# Patient Record
Sex: Female | Born: 1990 | Race: Asian | Hispanic: No | Marital: Single | Smoking: Never smoker
Health system: Southern US, Community
[De-identification: ages and names within clinical notes are randomized; demographics above are authoritative.]

## PROBLEM LIST (undated history)

## (undated) ENCOUNTER — Inpatient Hospital Stay (HOSPITAL_COMMUNITY): Payer: Self-pay

## (undated) DIAGNOSIS — K2970 Gastritis, unspecified, without bleeding: Secondary | ICD-10-CM

## (undated) DIAGNOSIS — O24419 Gestational diabetes mellitus in pregnancy, unspecified control: Secondary | ICD-10-CM

## (undated) DIAGNOSIS — Z789 Other specified health status: Secondary | ICD-10-CM

## (undated) HISTORY — PX: NO PAST SURGERIES: SHX2092

## (undated) HISTORY — DX: Other specified health status: Z78.9

---
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
## 2017-10-03 DIAGNOSIS — H5213 Myopia, bilateral: Secondary | ICD-10-CM | POA: Diagnosis not present

## 2018-01-28 NOTE — L&D Delivery Note (Signed)
Delivery Note Pt had PPROM at an unknown time followed by reg ctx; at 1325 she had her forebag broken for clear fluid and was 5cm. She progressed rapidly to complete and at 1:54 PM a viable female was delivered via Vaginal, Spontaneous (Presentation: LOA).  Tight nuchal x 1; delivered through using '[place].' APGAR: 9, 9; weight 5 lb 0.1 oz (2270 g). Cord clamped and cut by FOB after 1 min delay, then infant handed to Neo team at the warmer; hospital cord blood sample collected.  Placenta status: spont, intact.  Cord: 3 vessel  Anesthesia:  None Episiotomy: None Lacerations: None Est. Blood Loss (mL): 200  Mom to postpartum.  Baby to NICU.  Myrtis Ser CNM 09/18/2018, 2:33 PM  Please schedule this patient for Postpartum visit in: 6 weeks with the following provider: Any provider For C/S patients schedule nurse incision check in weeks 2 weeks: no High risk pregnancy complicated by: GDMA1, preterm delivery Delivery mode:  SVD Anticipated Birth Control:  other/unsure PP Procedures needed: 2 hour GTT  Schedule Integrated [place] visit: no

## 2018-02-25 DIAGNOSIS — Z3009 Encounter for other general counseling and advice on contraception: Secondary | ICD-10-CM | POA: Diagnosis not present

## 2018-02-25 DIAGNOSIS — Z3200 Encounter for pregnancy test, result unknown: Secondary | ICD-10-CM | POA: Diagnosis not present

## 2018-02-27 ENCOUNTER — Encounter (HOSPITAL_COMMUNITY): Payer: Self-pay

## 2018-02-27 ENCOUNTER — Other Ambulatory Visit: Payer: Self-pay

## 2018-02-27 ENCOUNTER — Inpatient Hospital Stay (HOSPITAL_COMMUNITY)
Admission: AD | Admit: 2018-02-27 | Discharge: 2018-02-27 | Disposition: A | Discharge status: Home / Self Care | Payer: BLUE CROSS/BLUE SHIELD | Attending: Obstetrics and Gynecology | Admitting: Obstetrics and Gynecology

## 2018-02-27 ENCOUNTER — Inpatient Hospital Stay (HOSPITAL_COMMUNITY): Payer: BLUE CROSS/BLUE SHIELD

## 2018-02-27 DIAGNOSIS — O368310 Maternal care for abnormalities of the fetal heart rate or rhythm, first trimester, not applicable or unspecified: Secondary | ICD-10-CM | POA: Diagnosis not present

## 2018-02-27 DIAGNOSIS — Z3A01 Less than 8 weeks gestation of pregnancy: Secondary | ICD-10-CM

## 2018-02-27 DIAGNOSIS — O26891 Other specified pregnancy related conditions, first trimester: Secondary | ICD-10-CM | POA: Insufficient documentation

## 2018-02-27 DIAGNOSIS — R109 Unspecified abdominal pain: Secondary | ICD-10-CM | POA: Insufficient documentation

## 2018-02-27 DIAGNOSIS — O208 Other hemorrhage in early pregnancy: Secondary | ICD-10-CM | POA: Diagnosis not present

## 2018-02-27 DIAGNOSIS — O209 Hemorrhage in early pregnancy, unspecified: Secondary | ICD-10-CM | POA: Diagnosis not present

## 2018-02-27 HISTORY — DX: Gastritis, unspecified, without bleeding: K29.70

## 2018-02-27 LAB — CBC
HCT: 40.8 % (ref 36.0–46.0)
HEMOGLOBIN: 14 g/dL (ref 12.0–15.0)
MCH: 29.7 pg (ref 26.0–34.0)
MCHC: 34.3 g/dL (ref 30.0–36.0)
MCV: 86.4 fL (ref 80.0–100.0)
Platelets: 222 10*3/uL (ref 150–400)
RBC: 4.72 MIL/uL (ref 3.87–5.11)
RDW: 12.8 % (ref 11.5–15.5)
WBC: 7.2 10*3/uL (ref 4.0–10.5)
nRBC: 0 % (ref 0.0–0.2)

## 2018-02-27 LAB — URINALYSIS, ROUTINE W REFLEX MICROSCOPIC
Bilirubin Urine: NEGATIVE
Glucose, UA: NEGATIVE mg/dL
Ketones, ur: 15 mg/dL — AB
Nitrite: NEGATIVE
Protein, ur: NEGATIVE mg/dL
Specific Gravity, Urine: 1.015 (ref 1.005–1.030)
pH: 7 (ref 5.0–8.0)

## 2018-02-27 LAB — URINALYSIS, MICROSCOPIC (REFLEX)

## 2018-02-27 LAB — TYPE AND SCREEN
ABO/RH(D): O POS
ANTIBODY SCREEN: NEGATIVE

## 2018-02-27 LAB — WET PREP, GENITAL
CLUE CELLS WET PREP: NONE SEEN
Sperm: NONE SEEN
Trich, Wet Prep: NONE SEEN
Yeast Wet Prep HPF POC: NONE SEEN

## 2018-02-27 LAB — ABO/RH: ABO/RH(D): O POS

## 2018-02-27 LAB — POCT PREGNANCY, URINE: Preg Test, Ur: POSITIVE — AB

## 2018-02-27 LAB — HCG, QUANTITATIVE, PREGNANCY: hCG, Beta Chain, Quant, S: 36487 m[IU]/mL — ABNORMAL HIGH (ref <5)

## 2018-02-27 NOTE — MAU Provider Note (Addendum)
History     CSN: [id number]  Arrival date and time: 02/27/18 62130804   None     Chief Complaint  Patient presents with  . Abdominal Pain   Elaine Lewis is a 28 y.o. G1P0 at 711w2d by Definite LMP who presents for Abdominal Pain.  She states the pain started about 0300 this morning and reports that it is sharp and "comes and goes."  Patient denies vaginal bleeding, but reports she had some bleeding last week immediately after intercourse.  She denies bleeding since and reports pelvic rest. She reports that she had a bowel movement this morning and denies issues.  She has not taken anything for the pain.  She expresses concern about her symptoms stating this is her first pregnancy.       OB History    Gravida  1   Para      Term      Preterm      AB      Living        SAB      TAB      Ectopic      Multiple      Live Births              Past Medical History:  Diagnosis Date  . Gastritis    As a child    Past Surgical History:  Procedure Laterality Date  . NO PAST SURGERIES      No family history on file.  Social History   Tobacco Use  . Smoking status: Never Smoker  . Smokeless tobacco: Never Used  Substance Use Topics  . Alcohol use: Not Currently    Frequency: Never  . Drug use: Never    Allergies: No Known Allergies  No medications prior to admission.    Review of Systems  Constitutional: Negative for chills and fever.  Gastrointestinal: Positive for abdominal pain and nausea. Negative for constipation, diarrhea and vomiting.  Genitourinary: Positive for vaginal discharge. Negative for dyspareunia, dysuria and vaginal bleeding.   Physical Exam   Blood pressure 136/72, pulse 67, temperature (!) 97.5 F (36.4 C), temperature source Oral, resp. rate 16, height 5\' 3"  (1.6 m), weight 75.6 kg, last menstrual period 01/07/2018, SpO2 100 %.  Physical Exam  Constitutional: She is oriented to person, place, and time. She appears  well-developed and well-nourished. No distress.  HENT:  Head: Normocephalic and atraumatic.  Eyes: Conjunctivae are normal.  Neck: Normal range of motion.  Cardiovascular: Normal rate, regular rhythm and normal heart sounds.  Respiratory: Effort normal and breath sounds normal.  GI: Soft. There is abdominal tenderness.  Musculoskeletal: Normal range of motion.        General: No edema.  Neurological: She is alert and oriented to person, place, and time.  Skin: Skin is warm and dry.  Psychiatric: She has a normal mood and affect. Her behavior is normal.    MAU Course  Procedures Results for orders placed or performed during the hospital encounter of 02/27/18 (from the past 24 hour(s))  Urinalysis, Routine w reflex microscopic     Status: Abnormal   Collection Time: 02/27/18  8:22 AM  Result Value Ref Range   Color, Urine YELLOW YELLOW   APPearance HAZY (A) CLEAR   Specific Gravity, Urine 1.015 1.005 - 1.030   pH 7.0 5.0 - 8.0   Glucose, UA NEGATIVE NEGATIVE mg/dL   Hgb urine dipstick TRACE (A) NEGATIVE   Bilirubin Urine NEGATIVE NEGATIVE  Ketones, ur 15 (A) NEGATIVE mg/dL   Protein, ur NEGATIVE NEGATIVE mg/dL   Nitrite NEGATIVE NEGATIVE   Leukocytes, UA MODERATE (A) NEGATIVE  Urinalysis, Microscopic (reflex)     Status: Abnormal   Collection Time: 02/27/18  8:22 AM  Result Value Ref Range   RBC / HPF 0-5 0 - 5 RBC/hpf   WBC, UA 6-10 0 - 5 WBC/hpf   Bacteria, UA FEW (A) NONE SEEN   Squamous Epithelial / LPF 6-10 0 - 5  Pregnancy, urine POC     Status: Abnormal   Collection Time: 02/27/18  8:32 AM  Result Value Ref Range   Preg Test, Ur POSITIVE (A) NEGATIVE  Wet prep, genital     Status: Abnormal   Collection Time: 02/27/18  8:57 AM  Result Value Ref Range   Yeast Wet Prep HPF POC NONE SEEN NONE SEEN   Trich, Wet Prep NONE SEEN NONE SEEN   Clue Cells Wet Prep HPF POC NONE SEEN NONE SEEN   WBC, Wet Prep HPF POC MANY (A) NONE SEEN   Sperm NONE SEEN   CBC     Status:  None   Collection Time: 02/27/18 10:56 AM  Result Value Ref Range   WBC 7.2 4.0 - 10.5 K/uL   RBC 4.72 3.87 - 5.11 MIL/uL   Hemoglobin 14.0 12.0 - 15.0 g/dL   HCT 16.140.8 09.636.0 - 04.546.0 %   MCV 86.4 80.0 - 100.0 fL   MCH 29.7 26.0 - 34.0 pg   MCHC 34.3 30.0 - 36.0 g/dL   RDW 40.912.8 81.111.5 - 91.415.5 %   Platelets 222 150 - 400 K/uL   nRBC 0.0 0.0 - 0.2 %  hCG, quantitative, pregnancy     Status: Abnormal   Collection Time: 02/27/18 10:56 AM  Result Value Ref Range   hCG, Beta Chain, Quant, S 36,487 (H) <5 mIU/mL  Type and screen     Status: None   Collection Time: 02/27/18 10:56 AM  Result Value Ref Range   ABO/RH(D) O POS    Antibody Screen NEG    Sample Expiration      03/02/2018 Performed at [facility], [address] [place], [place] [id number]    FINDINGS: Intrauterine gestational sac: Single  Yolk sac:  Visualized.  Embryo:  Visualized.  Cardiac Activity: Visualized.  Heart Rate: 65 bpm  CRL:   2.18 mm   5 w 5 d                  US EDC: 10/23/2018  Subchorionic hemorrhage:  Small subchorionic hemorrhage.  Maternal uterus/adnexae:  Right ovary: Normal  Left ovary: Normal  Other :None  Free fluid:  None  IMPRESSION: 1. There is a single living intrauterine gestation with an estimated gestational age of [redacted] weeks and 5 days. 2. Fetal bradycardia. Although findings may be seen with early gestational age, findings are suspicious and follow-up US in 10-14 days is advised to reassess fetal viability. 3. Small subchorionic hemorrhage.  MDM Pelvic Exam with cultures Labs: UA, Wet prep, and GC/CT Transvaginal US    Assessment and Plan  7 weeks by LMP Abdominal Cramping  -Exam findings discussed -Wet Prep/UA Pending -GC/CT collected and sent -Offered tylenol dosing; patient declines -Will await results   Follow Up (10:49 PM) Fetal Bradycardia SCH-Small  -US findings discussed with patient and need for follow up. -Dates changed to reflect US  EDD of 10/23/2018 -Informed of negative wet prep results. -[facility] including cause and  expectations. -Encouraged continued pelvic rest if bleeding continues. -No q/c -Encouraged to obtain a primary OB-information sheet provided -Outpatient [place] order placed for repeat US in ~ 2 weeks -Encouraged to call or return to MAU if symptoms worsen or with the onset of new symptoms. -Discharged to home in stable condition  Cherre Robins MSN, CNM 02/27/2018, 8:39 AM

## 2018-02-27 NOTE — MAU Note (Signed)
Pt was woken in the middle of the night with lower abdominal cramping, 5/10, now a 3/10. No bleeding, approx [redacted] weeks pregnant.

## 2018-02-27 NOTE — Discharge Instructions (Signed)
Vaginal Bleeding During Pregnancy, First Trimester  A small amount of bleeding from the vagina (spotting) is relatively common during early pregnancy. It usually stops on its own. Various things may cause bleeding or spotting during early pregnancy. Some bleeding may be related to the pregnancy, and some may not. In many cases, the bleeding is normal and is not a problem. However, bleeding can also be a sign of something serious. Be sure to tell your health care provider about any vaginal bleeding right away. Some possible causes of vaginal bleeding during the first trimester include:  Infection or inflammation of the cervix.  Growths (polyps) on the cervix.  Miscarriage or threatened miscarriage.  Pregnancy tissue developing outside of the uterus (ectopic pregnancy).  A mass of tissue developing in the uterus due to an egg being fertilized incorrectly (molar pregnancy). Follow these instructions at home: Activity  Follow instructions from your health care provider about limiting your activity. Ask what activities are safe for you.  If needed, make plans for someone to help with your regular activities.  Do not have sex or orgasms until your health care provider says that this is safe. General instructions  Take over-the-counter and prescription medicines only as told by your health care provider.  Pay attention to any changes in your symptoms.  Do not use tampons or douche.  Write down how many pads you use each day, how often you change pads, and how soaked (saturated) they are.  If you pass any tissue from your vagina, save the tissue so you can show it to your health care provider.  Keep all follow-up visits as told by your health care provider. This is important. Contact a health care provider if:  You have vaginal bleeding during any part of your pregnancy.  You have cramps or labor pains.  You have a fever. Get help right away if:  You have severe cramps in your  back or abdomen.  You pass large clots or a large amount of tissue from your vagina.  Your bleeding increases.  You feel light-headed or weak, or you faint.  You have chills.  You are leaking fluid or have a gush of fluid from your vagina. Summary  A small amount of bleeding (spotting) from the vagina is relatively common during early pregnancy.  Various things may cause bleeding or spotting in early pregnancy.  Be sure to tell your health care provider about any vaginal bleeding right away. This information is not intended to replace advice given to you by your health care provider. Make sure you discuss any questions you have with your health care provider. Document Released: 10/24/2004 Document Revised: 04/18/2016 Document Reviewed: 04/18/2016 Elsevier Interactive Patient Education  2019 Elsevier [employer].  [facility] Prenatal Care Providers   Center for [employer] [facility] at [facility]       Phone: [phone]  Center for [facility] at [place] Phone: [phone]  Center for [employer] at [facility]  Phone: [phone]  Center for [facility] at [facility]  Phone: [phone]  Center for [facility] at [place]  Phone: [phone]  [facility]       Phone: [phone]  [facility]    Phone: [phone]   [facility])    Phone: [phone]  [facility]    Phone: [phone]  [facility]    Phone: [phone]   [facility]  Phone: [phone]  [facility]  Phone: [phone]  [facility]   Phone: [phone]  [facility]    Phone: [phone]

## 2018-03-02 LAB — GC/CHLAMYDIA PROBE AMP (~~LOC~~) NOT AT ARMC
Chlamydia: NEGATIVE
Neisseria Gonorrhea: NEGATIVE

## 2018-03-30 ENCOUNTER — Ambulatory Visit (HOSPITAL_COMMUNITY)
Admission: RE | Admit: 2018-03-30 | Discharge: 2018-03-30 | Disposition: A | Discharge status: Home / Self Care | Payer: BLUE CROSS/BLUE SHIELD | Source: Ambulatory Visit

## 2018-03-30 ENCOUNTER — Other Ambulatory Visit (HOSPITAL_COMMUNITY): Payer: Self-pay

## 2018-03-30 ENCOUNTER — Ambulatory Visit (INDEPENDENT_AMBULATORY_CARE_PROVIDER_SITE_OTHER): Payer: BLUE CROSS/BLUE SHIELD | Admitting: *Deleted

## 2018-03-30 ENCOUNTER — Other Ambulatory Visit: Payer: Self-pay

## 2018-03-30 DIAGNOSIS — O209 Hemorrhage in early pregnancy, unspecified: Secondary | ICD-10-CM | POA: Insufficient documentation

## 2018-03-30 DIAGNOSIS — Z3A10 10 weeks gestation of pregnancy: Secondary | ICD-10-CM | POA: Diagnosis not present

## 2018-03-30 DIAGNOSIS — O208 Other hemorrhage in early pregnancy: Secondary | ICD-10-CM | POA: Diagnosis not present

## 2018-03-30 NOTE — Progress Notes (Signed)
Here for [place] results. Reviewed with Hogan,CNm and informed [place] US shows live pregancy at [redacted]w[redacted]d with EDD 10/25/18.Reccommend she start prenatal care and prenatal vitamins. She states already has scheduled with [place] and is taking prenatal vitamins.  Aima Mcwhirt,RN

## 2018-03-30 NOTE — Progress Notes (Signed)
I have reviewed the chart and agree with nursing staff's documentation of this patient's encounter.  Thressa Sheller DNP, CNM  03/30/18  4:42 PM

## 2018-04-17 ENCOUNTER — Ambulatory Visit (INDEPENDENT_AMBULATORY_CARE_PROVIDER_SITE_OTHER): Payer: BLUE CROSS/BLUE SHIELD | Admitting: *Deleted

## 2018-04-17 ENCOUNTER — Other Ambulatory Visit: Payer: Self-pay

## 2018-04-17 ENCOUNTER — Encounter: Payer: Self-pay | Admitting: *Deleted

## 2018-04-17 ENCOUNTER — Other Ambulatory Visit (HOSPITAL_COMMUNITY)
Admission: RE | Admit: 2018-04-17 | Discharge: 2018-04-17 | Disposition: A | Discharge status: Home / Self Care | Payer: BLUE CROSS/BLUE SHIELD | Source: Ambulatory Visit | Attending: Obstetrics & Gynecology | Admitting: Obstetrics & Gynecology

## 2018-04-17 VITALS — BP 105/67 | HR 64 | Wt 156.5 lb

## 2018-04-17 DIAGNOSIS — Z3400 Encounter for supervision of normal first pregnancy, unspecified trimester: Secondary | ICD-10-CM | POA: Diagnosis not present

## 2018-04-17 DIAGNOSIS — Z23 Encounter for immunization: Secondary | ICD-10-CM

## 2018-04-17 DIAGNOSIS — O0990 Supervision of high risk pregnancy, unspecified, unspecified trimester: Secondary | ICD-10-CM | POA: Insufficient documentation

## 2018-04-17 NOTE — Progress Notes (Signed)
New Ob intake completed and pregnancy information packet given. Medicaid Home form completed. Pt reports occasional spotting (pink/brown) and some abdominal cramping. She denies passing clots. Last Pap greater than 2 years ago - normal. Initial prenatal visit scheduled on 4/7.

## 2018-04-17 NOTE — Progress Notes (Signed)
I have reviewed the chart and agree with nursing staff's documentation of this patient's encounter. Patient has SIUP confirmed in MAU with [facility].   Vonzella Nipple, PA-C 04/17/2018 12:47 PM

## 2018-04-18 LAB — OBSTETRIC PANEL, INCLUDING HIV
ANTIBODY SCREEN: NEGATIVE
Basophils Absolute: 0 10*3/uL (ref 0.0–0.2)
Basos: 1 %
EOS (ABSOLUTE): 0.1 10*3/uL (ref 0.0–0.4)
Eos: 2 %
HIV Screen 4th Generation wRfx: NONREACTIVE
Hematocrit: 39.3 % (ref 34.0–46.6)
Hemoglobin: 13.3 g/dL (ref 11.1–15.9)
Hepatitis B Surface Ag: NEGATIVE
Immature Grans (Abs): 0 10*3/uL (ref 0.0–0.1)
Immature Granulocytes: 0 %
Lymphocytes Absolute: 0.9 10*3/uL (ref 0.7–3.1)
Lymphs: 12 %
MCH: 29 pg (ref 26.6–33.0)
MCHC: 33.8 g/dL (ref 31.5–35.7)
MCV: 86 fL (ref 79–97)
Monocytes Absolute: 0.5 10*3/uL (ref 0.1–0.9)
Monocytes: 6 %
Neutrophils Absolute: 6 10*3/uL (ref 1.4–7.0)
Neutrophils: 79 %
Platelets: 254 10*3/uL (ref 150–450)
RBC: 4.58 x10E6/uL (ref 3.77–5.28)
RDW: 12.9 % (ref 11.7–15.4)
RPR Ser Ql: NONREACTIVE
Rh Factor: POSITIVE
Rubella Antibodies, IGG: <0.9 index — ABNORMAL LOW (ref >0.99)
WBC: 7.5 10*3/uL (ref 3.4–10.8)

## 2018-04-19 LAB — CULTURE, OB URINE

## 2018-04-19 LAB — URINE CULTURE, OB REFLEX

## 2018-04-20 ENCOUNTER — Encounter: Payer: Self-pay | Admitting: *Deleted

## 2018-04-20 LAB — GC/CHLAMYDIA PROBE AMP (~~LOC~~) NOT AT ARMC
Chlamydia: NEGATIVE
NEISSERIA GONORRHEA: NEGATIVE

## 2018-04-28 ENCOUNTER — Telehealth: Payer: Self-pay | Admitting: Family Medicine

## 2018-04-28 NOTE — Telephone Encounter (Signed)
Left a detailed message about appt change  °

## 2018-05-04 ENCOUNTER — Other Ambulatory Visit (HOSPITAL_COMMUNITY)
Admission: RE | Admit: 2018-05-04 | Discharge: 2018-05-04 | Disposition: A | Discharge status: Home / Self Care | Payer: BLUE CROSS/BLUE SHIELD | Source: Ambulatory Visit | Attending: Medical | Admitting: Medical

## 2018-05-04 ENCOUNTER — Ambulatory Visit (INDEPENDENT_AMBULATORY_CARE_PROVIDER_SITE_OTHER): Payer: BLUE CROSS/BLUE SHIELD | Admitting: Advanced Practice Midwife

## 2018-05-04 ENCOUNTER — Encounter: Payer: Self-pay | Admitting: Advanced Practice Midwife

## 2018-05-04 ENCOUNTER — Other Ambulatory Visit: Payer: Self-pay

## 2018-05-04 VITALS — BP 120/81 | HR 89 | Temp 98.2°F | Wt 157.3 lb

## 2018-05-04 DIAGNOSIS — Z3400 Encounter for supervision of normal first pregnancy, unspecified trimester: Secondary | ICD-10-CM | POA: Diagnosis not present

## 2018-05-04 DIAGNOSIS — Z3482 Encounter for supervision of other normal pregnancy, second trimester: Secondary | ICD-10-CM | POA: Diagnosis not present

## 2018-05-04 DIAGNOSIS — Z3401 Encounter for supervision of normal first pregnancy, first trimester: Secondary | ICD-10-CM

## 2018-05-04 DIAGNOSIS — Z31430 Encounter of female for testing for genetic disease carrier status for procreative management: Secondary | ICD-10-CM | POA: Diagnosis not present

## 2018-05-04 DIAGNOSIS — Z3A15 15 weeks gestation of pregnancy: Secondary | ICD-10-CM

## 2018-05-04 NOTE — Progress Notes (Signed)
Subjective:   Elaine Lewis is a 28 y.o. G1P0 at 8076w1d by early ultrasound being seen today for her first obstetrical visit.  Her obstetrical history is significant for none . Patient does intend to breast feed. Pregnancy history fully reviewed.    Patient reports no complaints. [facility]. She has had some spotting off an on.   HISTORY: OB History  Gravida Para Term Preterm AB Living  1 0 0 0 0 0  SAB TAB Ectopic Multiple Live Births  0 0 0 0 0    # Outcome Date GA Lbr Len/2nd Weight Sex Delivery Anes PTL Lv  1 Current            p Last pap smear was done > 2 years ago and was unsure patient reports that she never heard back after the pap to know results, but assumed it was normal. She denies any hx of abnormal pap smears.   Past Medical History:  Diagnosis Date  . Gastritis    As a child  . Gastritis    age 28  . Medical history non-contributory    Past Surgical History:  Procedure Laterality Date  . NO PAST SURGERIES     Family History  Problem Relation Age of Onset  . Hyperlipidemia Mother   . Thyroid disease Mother   . Gout Father    Social History   Tobacco Use  . Smoking status: Never Smoker  . Smokeless tobacco: Never Used  Substance Use Topics  . Alcohol use: Not Currently    Frequency: Never  . Drug use: Never   No Known Allergies Current Outpatient Medications on File Prior to Visit  Medication Sig Dispense Refill  . Prenatal Vit-Fe Fumarate-FA (MULTIVITAMIN-PRENATAL) 27-0.8 MG TABS tablet Take 1 tablet by mouth daily at 12 noon.     No current facility-administered medications on file prior to visit.     Review of Systems Pertinent items noted in HPI and remainder of comprehensive ROS otherwise negative.  Exam   Vitals:   05/04/18 1029  BP: 120/81  Pulse: 89  Temp: 98.2 F (36.8 C)  Weight: 157 lb 4.8 oz (71.4 kg)   Fetal Heart Rate (bpm): 154  Physical Exam  Constitutional: She is oriented to person, place, and time and  well-developed, well-nourished, and in no distress. No distress.  HENT:  Head: Normocephalic.  Cardiovascular: Normal rate.  Pulmonary/Chest: Effort normal.  Abdominal: Soft. There is no abdominal tenderness. There is no rebound.  Genitourinary:    Genitourinary Comments:  External: no lesion Vagina: small amount of brown blood noted  Cervix: pink, smooth, no CMT, pap collected  Uterus: AGA    Neurological: She is alert and oriented to person, place, and time.  Skin: Skin is warm and dry.  Psychiatric: Affect normal.  Nursing note and vitals reviewed.  Assessment:   Pregnancy: G1P0 Patient Active Problem List   Diagnosis Date Noted  . Supervision of low-risk first pregnancy 04/17/2018  . Vaginal bleeding before [redacted] weeks gestation 02/27/2018     Plan:  1. Encounter for supervision of low-risk first pregnancy, antepartum - Baby scripts optimization  - US MFM OB COMP + 14 WK; Future - Panorama today/ SMA/CF - Pap smear today    Initial labs done at intake, reviewed today Continue prenatal vitamins. Genetic Screening discussed, NIPS: requested. Ultrasound discussed; fetal anatomic survey: ordered. Problem list reviewed and updated. The nature of [place] [facility] with multiple MDs and  other Advanced Practice Providers was explained to patient; also emphasized that residents, students are part of our team. Routine obstetric precautions reviewed. 50% of 45 min visit spent in counseling and coordination of care. Return in about 5 weeks (around 06/08/2018) for tele-visit .  Thressa Sheller DNP, CNM  05/04/18  10:44 AM

## 2018-05-04 NOTE — Patient Instructions (Addendum)
Social Distancing FAQ: How It Helps Prevent COVID-19 (Coronavirus) and Steps We Can Take to Protect Ourselves There are many things we can do to prevent the spread of COVID-19 (coronavirus): washing our hands, coughing into our elbows, avoiding touching our faces, staying home if we're feeling sick and social distancing. But what is social distancing? These frequently asked questions explain how to practice social distancing in order to protect yourself, your loved ones and your community. General Information About Social Distancing Q: What is social distancing? A: Social distancing is the practice of purposefully reducing close contact between people. According to the CDC, social distancing means: . Remaining out of "congregate settings" as much as possible. . Avoiding mass gatherings. . Maintaining distance of about 6 feet from others when possible. Q: Why is social distancing important? A: Social distancing is crucial for preventing the spread of contagious illnesses such as COVID-19 (coronavirus). COVID-19 can spread through coughing, sneezing and close contact. By minimizing the amount of close contact we have with others, we reduce our chances of catching the virus and spreading it to our loved ones and within our community. Q: Who is social distancing important for? A: Social distancing is important for all of [place], but those of [place] who are at higher risk of serious complications caused by UXNAT-55 should be especially cautious about social distancing. People who are at high risk of complications include: . Older adults. Marland Kitchen People who have serious chronic medical conditions like heart disease, diabetes and lung disease. Q: What is "flattening the curve"? What does it have to do with social distancing? A: "Flattening the curve" refers to reducing the number of people who are sick at one time. If there are high surges in the number of COVID-19 cases all at once, health care systems and resources  could potentially become overwhelmed. Efforts that help stop COVID-19 from spreading rapidly - like social distancing - help keep the number of people who are sick at one time as low as possible. Q: When should I start practicing social distancing? A: The best time to begin social distancing is before an illness like COVID-19 becomes widespread throughout your community. Each community's situation is unique, so it is important to follow the guidance of local government, health departments and health care providers. Currently in [place], gatherings of more than 10 people are banned. For the latest information on DDUKG-25 policies in [place], visit the [place] website and view news releases and executive orders. The advice below is applicable if you are symptom-free and have reasonable confidence that you have not had exposure to COVID-19. Always follow the guidance of local government, health departments and your health care providers. Visiting Public Spaces Q: How can I practice social distancing in the workplace? A: When possible, keeping about 6 feet of distance between yourself and others is key. It's also important to practice other preventative measures such as washing hands, avoiding touching your face, coughing into your elbow and staying home if you feel sick. Depending on your job and your community's situation, working from home may be an option. Always follow local guidance. Q: How can my child practice social distancing at school or at college?  A: Many schools and universities in the [place]. have postponed in-person classes; currently in [place], [employer] schools have been closed until May 15 (this is subject to change). It's important to follow local guidance, as each community's situation is unique. It's important for people  of all ages to follow preventative measures, including staying about 6 feet away from others, avoiding  touching your face, washing your hands and coughing into your elbow. Q: Should I be concerned about going to the grocery store? A: In any place where large numbers of people gather, there is potential risk for disease transmission. When you visit the grocery store, keep about 6 feet between yourself and others and use prevention techniques like avoiding touching your face and washing your hands. If possible, visit the store at times when there are likely to be fewer people shopping. Q: Should I take public transportation? A: If you have the option, driving yourself, walking to work or working from home can help reduce the number of people who are using public transportation, which benefits you and your community. In any of these situations, it's very important to keep distance between yourself and others in addition to practicing other preventative measures. Q: Should I stop visiting restaurants and bars? A: Currently in [place], restaurants and bars have been closed until further notice. Always follow local guidance. Avoiding public places as much as possible helps prevent diseases from spreading. If dining out is a non-essential activity, then it is generally in the best interest of you and your loved ones to avoid it. Q: Can I still go to the gym? A: Currently in [place], gyms have been closed until further notice. Always follow local guidance. Alternatives could include exercising in your home or yard or walking through your neighborhood. If you do go to the gym, wipe down and sanitize your equipment, keep distance between yourself and others, avoid touching other people and practice other preventative techniques.  Q: What about events and places where many people gather, such as concerts, festivals, sporting events and churches? A: Risk of disease transmission is much higher in large groups of people. Effective March 25 at 5 PM in [place], meetings of over 10 people are prohibited  in order to prevent COVID-19 from spreading rapidly. Avoiding these gatherings are generally in the best interest of protecting yourself, your loved ones and your community. Always follow local guidance. Meeting with Others Q: Should I stop visiting my elderly relatives and friends?  A: Older adults are at high risk of serious complications from LPFXT-02. Limiting their exposure as much as possible to those who may be sick or who may be carrying the disease is crucial. This is a great opportunity to try other methods of connecting, such as over the phone or through a video chat. Q: What about social distancing with other people in my household? A: Avoiding close contact within a household is almost impossible, and social distancing is mainly focused on large groups. However, if someone in your household is sick, it's important to minimize close contact with them as much as is reasonable. Q: Should I stop meeting up with 1 or 2 friends? Should I stop dating? A: While meeting up with another person who also is symptom-free may be alright in some situations, keep in mind that risk of disease transmission is higher in public places. Now may be a good time to consider other methods of connecting with others, such as over the phone or through a video chat. Q: Can I have a small group of my extended family and/or friends over to my house? A: If possible, it's best to postpone these kinds of gatherings and look for alternative ways to connect. Avoiding non-essential gatherings is important for preventing the spread  of disease. Q: Should my family and friends cancel big gathering events like weddings and birthday parties? A: Effective March 25 at 5 PM in [place], meetings of over 10 people are prohibited in order to prevent COVID-19 from spreading rapidly. Always follow local guidance. While it's difficult to postpone important events like these, it's also very important to protect our loved ones -  especially our loved ones who are most vulnerable. It may be best to postpone or alter your plans.  Q: If I'm avoiding in-person gatherings, how can I stay connected to others? A: There are many ways you can connect with friends: phone calls, text messages, emails and video chats are all great virtual options. While physical social distancing is important for our health, so is social interaction - trying alternative ways to stay connected is a good way to take care of your emotional health. If You Are Experiencing Symptoms Q: How should I approach social distancing if I start to feel sick? A: If you begin to experience symptoms, it's important to stay home and distance yourself from others. Always follow the guidance of your health care providers and local government. Q: Can I have visitors while I am in quarantine? A: Having visitors should be avoided as much as possible. Quarantining is an important way to protect your loved ones and community from picking up the disease; visitors are at high risk of catching the illness from you. Q: Can I go outside in my yard if I am in quarantine? A: Depending on where you live and your community's situation, going outside and getting some fresh air in your yard may be safe. Always follow the guidance of your health care providers and local government. AREA PEDIATRIC/FAMILY PRACTICE PHYSICIANS  Central[place]outheast [place] [phone]) . [facility] Davy Pique, MD; Gwendlyn Deutscher, MD; Walker Kehr, MD; Andria Frames, MD; McDiarmid, MD; Dutch Quint, MD; Nori Riis, MD; Mingo Amber, [address]., [place], [place] [address] o [phone] o Mon-Fri 8:30-12:30, 1:30-5:00 o Providers come to see babies at [facility] o Accepting Medicaid . [place] at [place] providers who accept newborns: Dorthy Cooler, MD; Orland Mustard, MD; Stephanie Acre, MD o [place], [place], [place] [address] o [phone] o Mon-Fri 8:00-5:30 o Babies seen by  providers at [facility] o Does NOT accept Medicaid o Please call early in hospitalization for appointment (limited availability)  . Mustard [place]ola, MD o [address] [place], [place] [address] o [phone] o Mon, Tue, Thur, Fri 8:30-5:00, Wed 10:00-7:00 (closed 1-2pm) o Babies seen by [facility] providers o Accepting Medicaid . [place], MD o [place], [place], [place] [address] o [phone] o Mon-Fri 8:30-5:00, Sat 8:30-12:00 o Provider comes to see babies at [place] Medicaid o Must have been referred from current patients or contacted office prior to delivery . [facility] ([place] for [place]) Franne Forts, MD; Tamera Punt, MD; Doneen Poisson, MD; Fatima Sanger, MD; Wynetta Emery, MD; Jess Barters, MD; Tami Ribas, MD; Herbert Moors, MD; Derrell Lolling, MD; Dorothyann Peng, MD; Lucious Groves, NP; Baldo Ash, NP o [place] [address], [place], [place] [address] o [phone] o Mon, Tue, Thur, Fri 8:30-5:30, Wed 9:30-5:30, Sat 8:30-12:30 o Babies seen by [facility] providers o Accepting Medicaid o Only accepting infants of first-time parents or siblings of current patients [facility] discharge coordinator will make follow-up appointment . Baltazar Najjar o [place] [address] [place], [place]  [address] o [phone]   Fax - [phone] . [facility] o [address], [place], [place]  [address] o Phone - [phone]   Fax [phone] . [place], [place], [place], [place]  [address] o [phone]  [place]ortheast [place] [phone]) . [facility] Reginal Lutes, MD; Jacklynn Ganong, MD; Torrie Mayers, MD; MD; Rosana Hoes, MD; Servando Salina, MD; Rose Fillers, MD; Rex Kras, MD; Corinna Capra, MD; Volney American, MD; Trilby Drummer, MD; Janann Colonel, MD; Jimmye Norman, [place], [place], [place] [address] o [phone] o Mon-Fri 8:30-5:00 (extended evenings Mon-Thur as needed), Sat-Sun  10:00-1:00 o Providers come to see babies at [place] Medicaid for families of first-time babies and families with all children in the household age 46 and under. Must register with office prior to making appointment (M-F only). . [place], NP; Tomi Bamberger, MD; Redmond School, MD; [place], [place] [address]., [place], [place] [address] o [phone] o Mon-Fri 8:00-5:00 o Babies seen by providers at [facility] o Does NOT accept Medicaid/Commercial Insurance Only . [facility] [place] ([facility])  Marnee Guarneri, MD; Drema [place], MD; Montine Circle, MD; Vilma Prader, MD; Vanita Panda, MD; Alfonso Ramus, MD; Ruthann Cancer, MD; Roxanne Mins, MD; Rosalva Ferron, MD; Polly Cobia, MD o [place]., [place], [place] [address] o [phone] o Mon-Fri 8:30-5:30, Sat (Oct.-Mar.) 9:00-1:00 o Babies seen by providers at [place] [phone]) . [facility] Peers, MD; Suzan Slick, MD o [address], [place], [place] [address] o [phone] o Mon-Fri 8:30-5:00, Sat 8:30-12:00 o Providers come to see babies at [facility] o Does NOT accept Medicaid . [place] at [place], [place]; [place], MD; [place], [place]; Nancy Fetter, MD; Moreen Fowler, [place], [place], [place] [address] o [phone] o Mon-Fri 8:00-5:00 o Babies seen by providers at [facility] o Does NOT accept Medicaid o Only accepting babies of parents who are patients o Please call early in hospitalization for appointment (limited availability) . [facility] Blanca Friend, MD; Sharlene Motts, MD; Rod Can, MD; Warner Mccreedy, NP; Sabra Heck, MD; Ermalinda Memos, MD; Sharlett Iles, NP; Aurther Loft, MD; Jerrye Beavers, MD; Marcello Moores, MD; Berline Lopes, MD; Charolette Forward, MD o [place]. [place], [place], [place] [address] o [phone] o Mon-Fri 8:00-5:00, Sat 9:00-12:00 o Providers come to see babies at [facility] o Does NOT accept  [facility] [phone]) . [place] at [place] providers accepting new patients: Dayna Ramus, NP; [place], [place], [place], [place] [address] o [phone] o Mon-Fri 8:00-5:00 o Babies seen by providers at [facility] o Does NOT accept Medicaid o Only accepting babies of parents who are patients o Please call early in hospitalization for appointment (limited availability) . [facility] Oswaldo [place], MD; Sheran Lawless, MD o [place]., [place], [place] [address] o [phone] (press 1 to schedule appointment) o Mon-Fri 8:00-5:00 o Providers come to see babies at [facility] o Does NOT accept Medicaid . [facility] Jodi Mourning, MD o [address] [place], [place] [address] o [phone] o Mon-Fri 8:30-5:00 (lunch 12:30-1:00), extended hours by appointment only Wed 5:00-6:30 o Babies seen by [facility] providers o Accepting Medicaid . [place] at Evalyn Casco, MD; [place], MD; Ethlyn Gallery, MD o [place], [place],  [address] o [phone] o Mon-Fri 8:00-5:00 o Babies seen by [facility] providers o Does NOT accept Medicaid . Therapist, music at [employer] o  Jerline Pain, MD; Yong Channel, MD; [place], [place] [address] [place], [place] [address] o [phone] o Mon-Fri 8:00-5:00 o Babies seen by [facility] providers o Does NOT accept Medicaid . [place], [place]; [place], [place]; [place], NP; Albertina Parr, MD; Frederic Jericho, MD; Ronney Lion, MD; Carlos Levering, NP; Jerelene Redden, NP; Tomasita Crumble, NP; Ronelle Nigh, NP; Corinna Lines, MD; Lead, MD o [place]., [place], [place] [address] o [phone] o Mon-Fri 8:30-5:00, Sat 10:00-1:00 o Providers come to see babies at [facility] o Does NOT accept Medicaid o Free prenatal information session Tuesdays at 4:45pm . [facility] Porfirio Oar, MD; [place], [place]; [place], [place]; [place], [place]., [place] [address] o [phone] o Mon-Fri 7:30-5:30 o Babies seen by [facility] providers . [facility] o [address], [place], [place], [place]  [address] o [phone]   Fax - [phone]  [place] [phone] & [phone]) . [place], [place] o [address] [place], [place] [address] o [phone] o Mon-Thur 8:00-6:00 o Providers come to see babies at [place] Medicaid . [place], NP; Melford Aase, MD; [place], [place]; [place], [place]., [place], [place] [address] o [phone] o Mon-Thur 7:30-7:30, Fri 7:30-4:30 o Babies seen by [facility] providers o Accepting Medicaid . [facility] Nyra Jabs, MD; Cristino Martes, NP; Gertie Baron, MD o [place] [address], [place], [place] [address] o [phone] o Mon-Fri 8:30-5:00, Sat 8:30-12:00 o Providers come to see babies at [place] Medicaid o Must have "Meet & Greet" appointment at office prior to delivery . [place] ([place]) Jodene Nam, MD; Juleen China, MD; Clydene Laming, [place] [address], [place], [place] [address] o [phone] o Mon-Wed 8:00-6:00, Thur-Fri 8:00-5:00, Sat 9:00-12:00 o Providers come to see babies at [facility] o Does NOT accept Medicaid o Only accepting siblings of current patients . [facility] [place], [place], [place], [place]  [address] o [phone]   Fax [phone] . [place] at [place] [address] [place], [place]  [address] o [phone]   Fax - [place] [phone] & [phone]) . Therapist, music at [place], DO; [place], [place]., [place], [place] [address] o [phone] o Mon-Fri 7:00-5:00 o Babies seen by [facility] providers o Does NOT  accept Medicaid . [place], MD; [place], [place]; [place], [place], [place], [place] [address] o [phone] o Mon-Fri 8:00-5:00 o Babies seen by [facility] providers o Accepting Medicaid . [place], MD; [place], [place]; [place], NP; [place], [place], [place],  [address] o [phone] o Mon-Fri 8:00-5:00 o Babies seen by providers at [place] [phone]) . [facility] [place], [place] o [place]., [place],  [address] o [phone] o Mon-Fri 8:00-5:00 o Babies seen by [facility] providers o Does NOT accept Medicaid o Limited availability, please call early in hospitalization to schedule follow-up . [facility] Leilani Merl, PA; Maisie Fus, MD; [place], MD; [place], [place]; Captain Cook, MD; [place], [facility] [address], [place],  [address] o [phone] o Mon-Fri 8:30-5:00, Sat 9:00-12:00 o Babies  seen by providers at [place] Medicaid o Please register online then schedule online or call office o [email or web address] . [place] ([place] [facility] [place]) Kristian Covey, NP; Dwyane Dee, MD; Leonidas Romberg, PA o [address] [place], [place], [place] [address] o [phone] o Mon-Fri 8:00-5:00 o Babies seen by providers at [facility] o Accepting Medicaid . [place] ([facility] [employer]) Dairl Ponder, MD; Rayvon Char, NP; Melina Modena, MD o [address] [place], [place], [place] [address] o [phone] o Mon-Fri 8:00-5:30, Sat&Sun by appointment (phones open at 8:30) o Babies seen by [facility] providers o Accepting Medicaid o Must be a first-time baby or sibling of current patient . [place], [address], [place], [place]  [address] o [phone]   Fax - [phone]  [place] [phone] & [phone]) . [place], [place]; [place], [place]; Benjamine Mola, MD; [place], [place]; Harrell Lark, MD o [address] [place], [place] [address] o [phone] o Mon-Thur 8:00-7:00, Fri 8:00-5:00, Sat 8:00-12:00, Sun 9:00-12:00 o Babies seen by [facility] providers o Accepting Medicaid . [facility] at [facility], MD; Ruthann Cancer, MD; [facility], MD o [address], [place], [place] [address] o [phone] o Mon-Thur 8:00-5:00 o Babies seen by providers at [facility] o Accepting Medicaid . [facility] at [place], MD; [place], MD; Amedeo Plenty, MD; Bobby Rumpf, MD; List, MD; Lavonia Drafts, MD; Ruthann Cancer, MD; Selinda Eon, MD; Audie Box, MD; Jim Like, MD; Christie Nottingham, MD; Hubbard Hartshorn, MD; Modena Nunnery, MD o [place]., [place], [place] [address] o [phone] o Mon-Fri 8:00-5:30, Sat (Oct.-Mar.) 9:00-1:00 o Babies seen by providers at [facility] o Accepting Medicaid o Must fill out new patient packet, available online at [email or web address] . [place] ([facility]) Barnabas Lister, NP; Kenton Kingfisher, NP; Claiborne Billings, NP; Rolla Plate, MD; [place], [place]; Carola Rhine, MD; Tyron Russell, MD; Delia Chimes, NP o [address], [place], [place] [address] o [phone] o Mon-Thur 8:00-5:30, Fri 8:00-5:00 o Babies seen by providers at [place] [phone]) . [place], [place]; [place], MD; Dennard Schaumann, MD; [place], [place] o [address] [place], [place] [address] o [phone] o Mon-Fri 8:00-5:00 o Babies seen by providers at [place] [phone]) . [place] at [place], [place]; Olen Pel, MD; [place], [place], [place], [place]  [address] o [phone] o Mon-Fri 8:00-5:00 o Babies seen by providers at [facility] o Does NOT accept Medicaid o Limited appointment availability, please call early in hospitalization  . Therapist, music at [place], [place]; [place], [place] [address] [place], [place] [address] o [phone] o Mon-Fri 8:00-5:00 o Babies seen by [facility] providers o Does NOT accept Medicaid . [facility] Su [place], MD; Guy Sandifer, MD; [place], [place]; [place], [place] [address], [place], [place] [address] o [phone] o Mon-Fri 8:00-5:00 o After hours clinic [facility] [place], [place] [address]) [phone] Mon-Fri 5:00-8:00, Sat 12:00-6:00, Sun 10:00-4:00 o Babies seen by [facility] providers o Accepting Medicaid . [place] at [facility] o [address], [place],   [address]  o [phone]   Fax - [phone]  [place] [phone]) . Therapist, music at [facility], MD o [address] [place], [place], [place] [address] o [phone] o Mon-Fri 8:00-5:00 o Babies seen by [facility] providers o Does NOT accept Medicaid . [place] ([place] [facility] [place]) Bing Neighbors, MD o [address] [place], [place],  [address] o [phone] o Mon-Thur 8:00-7:00, Fri 8:00-5:00, Sat 8:00-12:00 o Babies seen by providers at [facility] o Accepting Medicaid - but does not have vaccinations in office (must be received elsewhere) o Limited availability, please call early in hospitalization  [place] ([id number]) . [place], MD o [address] [place] [address] o [phone]  Fax [phone]   Childbirth Education Options: [facility] Department Classes:  Childbirth education classes can help you get ready for a positive parenting experience. You can also meet other expectant  parents and get free stuff for your baby. Each class runs for five weeks on the same night and costs $45 for the mother-to-be and her support person. Medicaid covers the cost if you are eligible. Call [phone] to register. [facility] Childbirth Education:  [phone] or [phone] or [email or web address]  Baby & Me Class: Discuss newborn & infant parenting and family adjustment issues with other new mothers in a relaxed environment. Each week brings a new speaker or baby-centered activity. We encourage new mothers to join [place] every Thursday at 11:00am. Babies birth until crawling. No registration or fee. Daddy [employer]: This course offers Dads-to-be the tools and knowledge needed to feel confident on their journey to becoming new fathers. Experienced dads, who have been trained as coaches, teach dads-to-be how to hold, comfort, diaper, swaddle and play with their infant while being able to support the new mom as well. A class for men taught by men. $25/dad Big Brother/Big Sister: Let your children share in the joy of a new brother or sister in this special class designed just for them. Class includes discussion about how families care for babies: swaddling, holding, diapering, safety as well as how they can be helpful in their new role. This class is designed for children ages 29 to 48, but any age is welcome. Please register each child individually. $5/child  Mom Talk: This mom-led group offers support and connection to mothers as they journey through the adjustments and struggles of that sometimes overwhelming first year after the birth of a child. Tuesdays at 10:00am and Thursdays at 6:00pm. Babies welcome. No registration or fee. Breastfeeding Support Group: This group is a mother-to-mother support circle where moms have the opportunity to share their breastfeeding experiences. A Lactation Consultant is present for questions and concerns. Meets each Tuesday at 11:00am. No fee or  registration. Breastfeeding Your Baby: Learn what to expect in the first days of breastfeeding your newborn.  This class will help you feel more confident with the skills needed to begin your breastfeeding experience. Many new mothers are concerned about breastfeeding after leaving the hospital. This class will also address the most common fears and challenges about breastfeeding during the first few weeks, months and beyond. (call for fee) Comfort Techniques and Tour: This 2 hour interactive class will provide you the opportunity to learn & practice hands-on techniques that can help relieve some of the discomfort of labor and encourage your baby to rotate toward the best position for birth. You and your partner will be able to try a variety of labor  positions with birth balls and rebozos as well as practice breathing, relaxation, and visualization techniques. A tour of the [facility] is included with this class. $20 per registrant and support person Childbirth Class- Weekend Option: This class is a Weekend version of our Birth & Baby series. It is designed for parents who have a difficult time fitting several weeks of classes into their schedule. It covers the care of your newborn and the basics of labor and childbirth. It also includes a [facility] and lunch. The class is held two consecutive days: beginning on Friday evening from 6:30 - 8:30 p.m. and the next day, Saturday from 9 a.m. - 4 p.m. (call for fee) Doren Custard Class: Interested in a waterbirth?  This informational class will help you discover whether waterbirth is the right fit for you. Education about waterbirth itself, supplies you would need and how to assemble your support team is what you can expect from this class. Some obstetrical practices require this class in order to pursue a waterbirth. (Not all obstetrical practices offer waterbirth-check with your healthcare provider.)  Register only the expectant mom, but you are encouraged to bring your partner to class! Required if planning waterbirth, no fee. Infant/Child CPR: Parents, grandparents, babysitters, and friends learn Cardio-Pulmonary Resuscitation skills for infants and children. You will also learn how to treat both conscious and unconscious choking in infants and children. This Family & Friends program does not offer certification. Register each participant individually to ensure that enough mannequins are available. (Call for fee) Grandparent Love: Expecting a grandbaby? This class is for you! Learn about the latest infant care and safety recommendations and ways to support your own child as he or she transitions into the parenting role. Taught by Registered Nurses who are childbirth instructors, but most importantly...they are grandmothers too! $10/person. Childbirth Class- Natural Childbirth: This series of 5 weekly classes is for expectant parents who want to learn and practice natural methods of coping with the process of labor and childbirth. Relaxation, breathing, massage, visualization, role of the partner, and helpful positioning are highlighted. Participants learn how to be confident in their body's ability to give birth. This class will empower and help parents make informed decisions about their own care. Includes discussion that will help new parents transition into the immediate postpartum period. [facility] is included. We suggest taking this class between 25-32 weeks, but it's only a recommendation. $75 per registrant and one support person or $30 Medicaid. Childbirth Class- 3 week Series: This option of 3 weekly classes helps you and your labor partner prepare for childbirth. Newborn care, labor & birth, cesarean birth, pain management, and comfort techniques are discussed and a [facility] is included. The class meets at the same time, on  the same day of the week for 3 consecutive weeks beginning with the starting date you choose. $60 for registrant and one support person.  Marvelous Multiples: Expecting twins, triplets, or more? This class covers the differences in labor, birth, parenting, and breastfeeding issues that face multiples' parents. NICU tour is included. Led by a Certified Childbirth Educator who is the mother of twins. No fee. Caring for Baby: This class is for expectant and adoptive parents who want to learn and practice the most up-to-date newborn care for their babies. Focus is on birth through the first six weeks of life. Topics include feeding, bathing, diapering, crying, umbilical cord care,  circumcision care and safe sleep. Parents learn to recognize symptoms of illness and when to call the pediatrician. Register only the mom-to-be and your partner or support person can plan to come with you! $10 per registrant and support person Childbirth Class- online option: This online class offers you the freedom to complete a Birth and Baby series in the comfort of your own home. The flexibility of this option allows you to review sections at your own pace, at times convenient to you and your support people. It includes additional video information, animations, quizzes, and extended activities. Get organized with helpful eClass tools, checklists, and trackers. Once you register online for the class, you will receive an email within a few days to accept the invitation and begin the class when the time is right for you. The content will be available to you for 60 days. $60 for 60 days of online access for you and your support people.  Local Doulas: Natural Baby Doulas [email or web address] Tel: [phone] [email or web address] [phone] [email or web address] The Labor Hassell Halim  (also do waterbirth tub  rental) [phone] [email or web address] Triad Birth Doula [phone] [email or web address].fi [facility] Rhythms  [phone] [email or web address] [employer] (PADA) [email or web address] [place] Birth and Baby  [email or web address]/ Considering Waterbirth? Guide for patients at Center for [employer]  Why consider waterbirth?  . Gentle birth for babies . Less pain medicine used in labor . May allow for passive descent/less pushing . May reduce perineal tears  . More mobility and instinctive maternal position changes . Increased maternal relaxation . Reduced blood pressure in labor  Is waterbirth safe? What are the risks of infection, drowning or other complications?  . Infection: o Very low risk (3.7 % for tub vs 4.8% for bed) o 7 in 8000 waterbirths with documented infection o Poorly cleaned equipment most common cause o Slightly lower group B strep transmission rate  . Drowning o Maternal:  - Very low risk   - Related to seizures or fainting o Newborn:  - Very low risk. No evidence of increased risk of respiratory problems in multiple large studies - Physiological protection from breathing under water - Avoid underwater birth if there are any fetal complications - Once baby's head is out of the water, keep it out.  . Birth complication o Some reports of cord trauma, but risk decreased by bringing baby to surface gradually o No evidence of increased risk of shoulder dystocia. Mothers can usually change positions faster in water than in a bed, possibly aiding the maneuvers to free the shoulder.   You must attend a Doren Custard class at [facility]  3rd Wednesday of every month from 7-9pm  [employer] by calling [phone] or online at [email or web address]  Bring [place] the certificate from the class to  your prenatal appointment  Meet with a midwife at 36 weeks to see if you can still plan a waterbirth and to sign the consent.   Purchase or rent the following supplies:   Water Birth Pool (Birth Pool in a Box or [place] for instance)  (Tubs start ~$125)  Single-use disposable tub liner designed for your brand of tub  New garden hose labeled "lead-free", "suitable for drinking water",  Electric drain pump to remove water (We recommend 792 gallon per hour or greater pump.)   Separate garden hose to remove the dirty water  Fish net  Bathing suit top (optional)  Long-handled mirror (optional)  Places to purchase or rent supplies  GotWebTools.is for tub purchases  and supplies  Waterbirthsolutions.com for tub purchases and supplies  The Labor Ladies (www.[email or web address]) $275 for tub rental/set-up & take down/kit   [employer] (http://www.fleming.com/.htm) Information regarding doulas (labor support) who provide pool rentals  Our practice has a Birth Pool in a Box tub at the hospital that you may borrow on a first-come-first-served basis. It is your responsibility to to set up, clean and break down the tub. We cannot guarantee the availability of this tub in advance. You are responsible for bringing all accessories listed above. If you do not have all necessary supplies you cannot have a waterbirth.    Things that would prevent you from having a waterbirth:  Premature, <37wks  Previous cesarean birth  Presence of thick meconium-stained fluid  Multiple gestation (Twins, triplets, etc.)  Uncontrolled diabetes or gestational diabetes requiring medication  Hypertension requiring medication or diagnosis of pre-eclampsia  Heavy vaginal bleeding  Non-reassuring fetal heart rate  Active infection (MRSA, etc.). Group B Strep is NOT a contraindication for  waterbirth.  If your labor has to be induced and induction method requires continuous   monitoring of the baby's heart rate  Other risks/issues identified by your obstetrical provider  Please remember that birth is unpredictable. Under certain unforeseeable circumstances your provider may advise against giving birth in the tub. These decisions will be made on a case-by-case basis and with the safety of you and your baby as our highest priority.

## 2018-05-05 ENCOUNTER — Encounter: Payer: Self-pay | Admitting: *Deleted

## 2018-05-05 ENCOUNTER — Encounter: Payer: BLUE CROSS/BLUE SHIELD | Admitting: Medical

## 2018-05-06 LAB — CYTOLOGY - PAP: Diagnosis: NEGATIVE

## 2018-05-11 LAB — SMN1 COPY NUMBER ANALYSIS (SMA CARRIER SCREENING)

## 2018-05-18 ENCOUNTER — Encounter: Payer: Self-pay | Admitting: *Deleted

## 2018-06-03 ENCOUNTER — Other Ambulatory Visit (HOSPITAL_COMMUNITY): Payer: Self-pay | Admitting: *Deleted

## 2018-06-03 ENCOUNTER — Telehealth: Payer: Self-pay | Admitting: Obstetrics & Gynecology

## 2018-06-03 ENCOUNTER — Ambulatory Visit (HOSPITAL_COMMUNITY)
Admission: RE | Admit: 2018-06-03 | Discharge: 2018-06-03 | Disposition: A | Discharge status: Home / Self Care | Payer: BLUE CROSS/BLUE SHIELD | Source: Ambulatory Visit | Attending: Obstetrics and Gynecology | Admitting: Obstetrics and Gynecology

## 2018-06-03 ENCOUNTER — Other Ambulatory Visit: Payer: Self-pay

## 2018-06-03 DIAGNOSIS — Z3A19 19 weeks gestation of pregnancy: Secondary | ICD-10-CM | POA: Diagnosis not present

## 2018-06-03 DIAGNOSIS — Z3400 Encounter for supervision of normal first pregnancy, unspecified trimester: Secondary | ICD-10-CM | POA: Diagnosis not present

## 2018-06-03 DIAGNOSIS — Z363 Encounter for antenatal screening for malformations: Secondary | ICD-10-CM | POA: Diagnosis not present

## 2018-06-03 DIAGNOSIS — Z362 Encounter for other antenatal screening follow-up: Secondary | ICD-10-CM

## 2018-06-03 NOTE — Telephone Encounter (Signed)
Called the patient to inform of missed appointment. Left a detailed voicemail and sending a missed appointment letter with the instructions for webex included.

## 2018-06-08 ENCOUNTER — Ambulatory Visit: Payer: BLUE CROSS/BLUE SHIELD

## 2018-06-08 DIAGNOSIS — Z91199 Patient's noncompliance with other medical treatment and regimen due to unspecified reason: Secondary | ICD-10-CM

## 2018-06-08 DIAGNOSIS — Z5329 Procedure and treatment not carried out because of patient's decision for other reasons: Secondary | ICD-10-CM

## 2018-06-08 DIAGNOSIS — Z3400 Encounter for supervision of normal first pregnancy, unspecified trimester: Secondary | ICD-10-CM

## 2018-06-08 NOTE — Progress Notes (Signed)
Attempted to call patient for virtual visit. No answer x2. Will reschedule.  Rolm Bookbinder, CNM  06/08/18 8:35 AM

## 2018-06-08 NOTE — Progress Notes (Signed)
Called pt at 8:26, no answer, left VM that I will call back in 10 mins.   Calloed back at 8:33, still no answer, left VMstating appt will be rescheduled.

## 2018-06-11 ENCOUNTER — Other Ambulatory Visit: Payer: Self-pay

## 2018-06-11 ENCOUNTER — Ambulatory Visit (INDEPENDENT_AMBULATORY_CARE_PROVIDER_SITE_OTHER): Payer: BLUE CROSS/BLUE SHIELD | Admitting: Obstetrics and Gynecology

## 2018-06-11 DIAGNOSIS — Z3A20 20 weeks gestation of pregnancy: Secondary | ICD-10-CM

## 2018-06-11 DIAGNOSIS — Z3400 Encounter for supervision of normal first pregnancy, unspecified trimester: Secondary | ICD-10-CM

## 2018-06-11 DIAGNOSIS — Z3402 Encounter for supervision of normal first pregnancy, second trimester: Secondary | ICD-10-CM

## 2018-06-11 NOTE — Progress Notes (Signed)
I connected with  Keyonce Lutfi on 06/11/18 at  2:15 PM EDT by telephone and verified that I am speaking with the correct person using two identifiers.   I discussed the limitations, risks, security and privacy concerns of performing an evaluation and management service by telephone and the availability of in person appointments. I also discussed with the patient that there may be a patient responsible charge related to this service. The patient expressed understanding and agreed to proceed.  Janene Madeira Syann Cupples, CMA 06/11/2018  1:58 PM

## 2018-06-11 NOTE — Progress Notes (Signed)
   TELEHEALTH VIRTUAL OBSTETRICS VISIT ENCOUNTER NOTE  I connected with Elaine Lewis on 06/11/18 at  2:15 PM EDT by Webex at home and verified that I am speaking with the correct person using two identifiers.   I discussed the limitations, risks, security and privacy concerns of performing an evaluation and management service by telephone and the availability of in person appointments. I also discussed with the patient that there may be a patient responsible charge related to this service. The patient expressed understanding and agreed to proceed.  Subjective:  Elaine Lewis is a 28 y.o. G1P0 at [redacted]w[redacted]d being followed for ongoing prenatal care.  She is currently monitored for the following issues for this low-risk pregnancy and has Vaginal bleeding before [redacted] weeks gestation and Supervision of low-risk first pregnancy on their problem list.  Patient reports no complaints. Reports fetal movement. Denies any contractions, bleeding or leaking of fluid.   The following portions of the patient's history were reviewed and updated as appropriate: allergies, current medications, past family history, past medical history, past social history, past surgical history and problem list.   Objective:   General:  Alert, oriented and cooperative.   Mental Status: Normal mood and affect perceived. Normal judgment and thought content.  Rest of physical exam deferred due to type of encounter  Assessment and Plan:  Pregnancy: G1P0 at [redacted]w[redacted]d  1. Encounter for supervision of low-risk first pregnancy, antepartum  Doing well BP reviewed in Baby scripts Patient requests completion of genetic screening CF/ and hemoglobinopathy. Will complete at 28 weeks.  Next visit @ 24 weeks will be virtual, in office visit @ 28 weeks.    There are no diagnoses linked to this encounter. Preterm labor symptoms and general obstetric precautions including but not limited to vaginal bleeding, contractions, leaking of fluid and fetal  movement were reviewed in detail with the patient.  I discussed the assessment and treatment plan with the patient. The patient was provided an opportunity to ask questions and all were answered. The patient agreed with the plan and demonstrated an understanding of the instructions. The patient was advised to call back or seek an in-person office evaluation/go to [facility]U at [facility] for any urgent or concerning symptoms. Please refer to After Visit Summary for other counseling recommendations.   I provided 11 minutes of non-face-to-face time during this encounter.  Return in about 4 weeks (around 07/09/2018) for For virtual visit, For NST.  Future Appointments  Date Time Provider Department Center  06/11/2018  2:15 PM Rasch, Harolyn Rutherford, NP WOC-WOCA WOC  07/02/2018 11:15 AM WH-MFC [place] 4 WH-MFCUS MFC-US    Venia Carbon, NP Center for [employer], [facility]

## 2018-06-26 ENCOUNTER — Other Ambulatory Visit: Payer: Self-pay

## 2018-06-26 ENCOUNTER — Ambulatory Visit (INDEPENDENT_AMBULATORY_CARE_PROVIDER_SITE_OTHER): Payer: BLUE CROSS/BLUE SHIELD | Admitting: *Deleted

## 2018-06-26 ENCOUNTER — Other Ambulatory Visit (HOSPITAL_COMMUNITY)
Admission: RE | Admit: 2018-06-26 | Discharge: 2018-06-26 | Disposition: A | Discharge status: Home / Self Care | Payer: BLUE CROSS/BLUE SHIELD | Source: Ambulatory Visit | Attending: Family Medicine | Admitting: Family Medicine

## 2018-06-26 VITALS — BP 121/75 | HR 79 | Temp 98.1°F | Ht 63.0 in | Wt 159.9 lb

## 2018-06-26 DIAGNOSIS — N898 Other specified noninflammatory disorders of vagina: Secondary | ICD-10-CM

## 2018-06-26 DIAGNOSIS — N390 Urinary tract infection, site not specified: Secondary | ICD-10-CM

## 2018-06-26 DIAGNOSIS — R319 Hematuria, unspecified: Secondary | ICD-10-CM | POA: Diagnosis not present

## 2018-06-26 LAB — POCT URINALYSIS DIP (DEVICE)
Bilirubin Urine: NEGATIVE
Glucose, UA: NEGATIVE mg/dL
Ketones, ur: NEGATIVE mg/dL
Nitrite: NEGATIVE
Protein, ur: NEGATIVE mg/dL
Specific Gravity, Urine: 1.02 (ref 1.005–1.030)
Urobilinogen, UA: 0.2 mg/dL (ref 0.0–1.0)
pH: 7 (ref 5.0–8.0)

## 2018-06-26 MED ORDER — NITROFURANTOIN MONOHYD MACRO 100 MG PO CAPS: 100.0000 mg | ORAL_CAPSULE | Freq: Two times a day (BID) | ORAL | 0 refills | Status: DC

## 2018-06-26 NOTE — Progress Notes (Signed)
Pt presents to clinic for self swab d/t new vaginal odor.  She states the odor has been present for 2 weeks.  She denies any change to her discharge, she denies itching, denies burning with urination or back pain.  Pt states that during the night only, she will urinate frequently and doesn't feel as if she empties her bladder but in the morning it has resolved.    Udip showed small leukocytes and trace blood.  Reviewed with Dr. Marice Potter.  Urine Culture to be sent and Macrobid ordered for pt.  Pt advised of results and need for culture.  Pt instructed on how to perform a self swab.  Pt advised that results can take 24-48 hours to come back and she would be contacted with any abnormal results.  Pt verbalized understanding.

## 2018-06-28 LAB — CULTURE, OB URINE

## 2018-06-28 LAB — URINE CULTURE, OB REFLEX

## 2018-06-29 LAB — CERVICOVAGINAL ANCILLARY ONLY
Bacterial vaginitis: NEGATIVE
Candida vaginitis: NEGATIVE
Chlamydia: NEGATIVE
Neisseria Gonorrhea: NEGATIVE
Trichomonas: NEGATIVE

## 2018-07-02 ENCOUNTER — Other Ambulatory Visit: Payer: Self-pay

## 2018-07-02 ENCOUNTER — Ambulatory Visit (HOSPITAL_COMMUNITY)
Admission: RE | Admit: 2018-07-02 | Discharge: 2018-07-02 | Disposition: A | Discharge status: Home / Self Care | Payer: BLUE CROSS/BLUE SHIELD | Source: Ambulatory Visit | Attending: Obstetrics and Gynecology | Admitting: Obstetrics and Gynecology

## 2018-07-02 DIAGNOSIS — Z362 Encounter for other antenatal screening follow-up: Secondary | ICD-10-CM | POA: Diagnosis not present

## 2018-07-02 DIAGNOSIS — Z3A23 23 weeks gestation of pregnancy: Secondary | ICD-10-CM

## 2018-07-02 NOTE — Progress Notes (Signed)
I have reviewed this chart and agree with the RN/CMA assessment and management.    Silvester Reierson C Yussef Jorge, MD, FACOG Attending Physician, Faculty Practice [facility]  

## 2018-07-06 ENCOUNTER — Encounter: Payer: Self-pay | Admitting: Physician Assistant

## 2018-07-06 ENCOUNTER — Telehealth: Payer: BLUE CROSS/BLUE SHIELD | Admitting: Physician Assistant

## 2018-07-06 DIAGNOSIS — R300 Dysuria: Secondary | ICD-10-CM

## 2018-07-06 DIAGNOSIS — M549 Dorsalgia, unspecified: Secondary | ICD-10-CM

## 2018-07-06 DIAGNOSIS — R319 Hematuria, unspecified: Secondary | ICD-10-CM

## 2018-07-06 NOTE — Progress Notes (Signed)
Based on what you shared with me, I feel your condition warrants further evaluation and I recommend that you be seen for a face to face office visit. Ms. Gunnerson,  Since you recently were treated with antibiotic of UTI, and you have recurrence of your symptoms, it is recommended that you have a face to face evaluation for further work of your symptoms to possibly include urine culture to know which bacteria is causing your infection and tailor the antibiotics accordingly.     NOTE: If you entered your credit card information for this eVisit, you will not be charged. You may see a "hold" on your card for the $35 but that hold will drop off and you will not have a charge processed.  If you are having a true medical emergency please call 911.  If you need an urgent face to face visit, [place] has four urgent care centers for your convenience.    PLEASE NOTE: THE INSTACARE LOCATIONS AND URGENT CARE CLINICS DO NOT HAVE THE TESTING FOR CORONAVIRUS COVID19 AVAILABLE.  IF YOU FEEL YOU NEED THIS TEST YOU MUST GO TO A TRIAGE LOCATION AT [place]   [email or web address] to reserve your spot online an avoid wait times  [facility] [address] [place], [place] [address] Modified hours of operation: Monday-Friday, 12 PM to 6 PM  Saturday & Sunday 10 AM to 4 PM *Across the street from [place] (New Address!) [address] [place], [place] [address] *Just off [address], across the road from [place] hours of operation: Monday-Friday, 12 PM to 6 PM  Closed Saturday & Sunday  [facility]'s modified hours of operation will be in effect from May 1 until May 31   The following sites will take your insurance:  . [facility] [place] a Provider at this Location  [address] [place], [place] [address] . 10 am to 8 pm  Monday-Friday . 12 pm to 8 pm Saturday-Sunday   . [facility] at [place] a Provider at this Location  [address], [place], [place] [address] . 8 am to 8 pm Monday-Friday . 9 am to 6 pm Saturday . 11 am to 6 pm Sunday   . [facility] at [place] Get Driving Directions  [address] [place], [place] [address] . 8 am to 8 pm Monday-Friday . 8 am to 4 pm Saturday-Sunday   Your e-visit answers were reviewed by a board certified advanced clinical practitioner to complete your personal care plan.  Thank you for using e-Visits. I spent 5-10 minutes on review and completion of this note- Lacy Duverney [facility]

## 2018-07-07 ENCOUNTER — Telehealth: Payer: Self-pay

## 2018-07-07 NOTE — Telephone Encounter (Signed)
Called the patient to confirm the upcoming appointment. Left a detailed voicemail message of how to access mychart as well as our office number. °

## 2018-07-09 ENCOUNTER — Telehealth (INDEPENDENT_AMBULATORY_CARE_PROVIDER_SITE_OTHER): Payer: Medicaid Other | Admitting: Obstetrics and Gynecology

## 2018-07-09 ENCOUNTER — Other Ambulatory Visit: Payer: Self-pay

## 2018-07-09 ENCOUNTER — Telehealth: Payer: Self-pay | Admitting: Obstetrics and Gynecology

## 2018-07-09 ENCOUNTER — Telehealth: Payer: BLUE CROSS/BLUE SHIELD | Admitting: Obstetrics and Gynecology

## 2018-07-09 DIAGNOSIS — Z3402 Encounter for supervision of normal first pregnancy, second trimester: Secondary | ICD-10-CM

## 2018-07-09 DIAGNOSIS — Z3A24 24 weeks gestation of pregnancy: Secondary | ICD-10-CM

## 2018-07-09 DIAGNOSIS — Z3400 Encounter for supervision of normal first pregnancy, unspecified trimester: Secondary | ICD-10-CM

## 2018-07-09 NOTE — Progress Notes (Signed)
I connected with  Elaine Lewis on 07/09/18 at  9:15 AM EDT by telephone and verified that I am speaking with the correct person using two identifiers.   I discussed the limitations, risks, security and privacy concerns of performing an evaluation and management service by telephone and the availability of in person appointments. I also discussed with the patient that there may be a patient responsible charge related to this service. The patient expressed understanding and agreed to proceed.  Aviva Signs Jessie Schrieber, CMA 07/09/2018  9:22 AM   Bleeding earlier in week had e-visit with a provider and it stopped. Blood was in urine/ states she was taking the antibiotics and noticed bleeding.

## 2018-07-09 NOTE — Telephone Encounter (Signed)
Attempted to call patient with her next ob appointment and 2 hr gtt ( 7/10 @ 8:15). No answer, left a detailed voicemail with this appointment information. Patient was instructed to wear a face mask for the entire appointment and no visitors are allowed with her. Patient also instructed that someone will be calling her a a couple days before her appointment to do her covid-19 symptom screening. Appointment reminders were mailed

## 2018-07-09 NOTE — Progress Notes (Signed)
   TELEHEALTH VIRTUAL OBSTETRICS VISIT ENCOUNTER NOTE  I connected with Elaine Lewis on 07/09/18 at  9:15 AM EDT by telephone at home and verified that I am speaking with the correct person using two identifiers.   I discussed the limitations, risks, security and privacy concerns of performing an evaluation and management service by telephone and the availability of in person appointments. I also discussed with the patient that there may be a patient responsible charge related to this service. The patient expressed understanding and agreed to proceed.  Subjective:  Elaine Lewis is a 28 y.o. G1P0 at [redacted]w[redacted]d being followed for ongoing prenatal care.  She is currently monitored for the following issues for this low-risk pregnancy and has Vaginal bleeding before [redacted] weeks gestation and Supervision of low-risk first pregnancy on their problem list.  Patient reports no complaints. Reports fetal movement. Denies any contractions, bleeding or leaking of fluid.   The following portions of the patient's history were reviewed and updated as appropriate: allergies, current medications, past family history, past medical history, past social history, past surgical history and problem list.   Objective:   General:  Alert, oriented and cooperative.   Mental Status: Normal mood and affect perceived. Normal judgment and thought content.  Rest of physical exam deferred due to type of encounter  Assessment and Plan:  Pregnancy: G1P0 at [redacted]w[redacted]d 1. Encounter for supervision of low-risk first pregnancy, antepartum   BP 102/65 today  Doing well Discussed use of MAU when office is closed.   Preterm labor symptoms and general obstetric precautions including but not limited to vaginal bleeding, contractions, leaking of fluid and fetal movement were reviewed in detail with the patient.  I discussed the assessment and treatment plan with the patient. The patient was provided an opportunity to ask questions and all were  answered. The patient agreed with the plan and demonstrated an understanding of the instructions. The patient was advised to call back or seek an in-person office evaluation/go to [facility]U at [facility] for any urgent or concerning symptoms. Please refer to After Visit Summary for other counseling recommendations.   I provided 12 minutes of non-face-to-face time during this encounter.  Return in about 4 weeks (around 08/06/2018) for Return in 4 weeks for 2 hour GTT come fasting .  No future appointments.  Noni Saupe, NP Center for [employer], [place]

## 2018-08-03 ENCOUNTER — Other Ambulatory Visit: Payer: Self-pay | Admitting: *Deleted

## 2018-08-03 DIAGNOSIS — Z3400 Encounter for supervision of normal first pregnancy, unspecified trimester: Secondary | ICD-10-CM

## 2018-08-06 ENCOUNTER — Telehealth: Payer: Self-pay | Admitting: Obstetrics & Gynecology

## 2018-08-06 NOTE — Telephone Encounter (Signed)
Called the patient to complete the pre-screen. The patient answered no to COVID19 symptoms and/or being previously diagnosed. Informed the patient of the wearing a face mask, sanitizing hands at the sanitizing station upon entering our office, and no visitors or children are allowed due to the COVID19 restrictions. The patient verbalized understanding. °

## 2018-08-07 ENCOUNTER — Other Ambulatory Visit: Payer: BC Managed Care – PPO

## 2018-08-07 ENCOUNTER — Other Ambulatory Visit: Payer: Self-pay

## 2018-08-07 ENCOUNTER — Encounter: Payer: BC Managed Care – PPO | Admitting: Family Medicine

## 2018-08-07 ENCOUNTER — Ambulatory Visit (INDEPENDENT_AMBULATORY_CARE_PROVIDER_SITE_OTHER): Payer: BC Managed Care – PPO | Admitting: Family Medicine

## 2018-08-07 VITALS — BP 108/72 | HR 78 | Wt 161.4 lb

## 2018-08-07 DIAGNOSIS — Z23 Encounter for immunization: Secondary | ICD-10-CM | POA: Diagnosis not present

## 2018-08-07 DIAGNOSIS — Z3400 Encounter for supervision of normal first pregnancy, unspecified trimester: Secondary | ICD-10-CM

## 2018-08-07 DIAGNOSIS — O4693 Antepartum hemorrhage, unspecified, third trimester: Secondary | ICD-10-CM

## 2018-08-07 DIAGNOSIS — Z3A28 28 weeks gestation of pregnancy: Secondary | ICD-10-CM

## 2018-08-07 NOTE — Progress Notes (Signed)
   PRENATAL VISIT NOTE  Subjective:  Elaine Lewis is a 28 y.o. G1P0 at [redacted]w[redacted]d being seen today for ongoing prenatal care.  She is currently monitored for the following issues for this low-risk pregnancy and has Vaginal bleeding before [redacted] weeks gestation and Supervision of low-risk first pregnancy on their problem list.  Patient reports no complaints.  Contractions: Not present. Vag. Bleeding: None.  Movement: Present. Denies leaking of fluid.   The following portions of the patient's history were reviewed and updated as appropriate: allergies, current medications, past family history, past medical history, past social history, past surgical history and problem list.   Objective:   Vitals:   08/07/18 0825  BP: 108/72  Pulse: 78  Weight: 161 lb 6.4 oz (73.2 kg)    Fetal Status: Fetal Heart Rate (bpm): 142   Movement: Present     General:  Alert, oriented and cooperative. Patient is in no acute distress.  Skin: Skin is warm and dry. No rash noted.   Cardiovascular: Normal heart rate noted  Respiratory: Normal respiratory effort, no problems with respiration noted  Abdomen: Soft, gravid, appropriate for gestational age.  Pain/Pressure: Present     Pelvic: Cervical exam deferred        Extremities: Normal range of motion.  Edema: None  Mental Status: Normal mood and affect. Normal behavior. Normal judgment and thought content.   Assessment and Plan:  Pregnancy: G1P0 at [redacted]w[redacted]d 1. Encounter for supervision of low-risk first pregnancy, antepartum FHT and FH normal. 28 week labs - Genetic Screening - Tdap vaccine greater than or equal to 7yo IM  Preterm labor symptoms and general obstetric precautions including but not limited to vaginal bleeding, contractions, leaking of fluid and fetal movement were reviewed in detail with the patient. Please refer to After Visit Summary for other counseling recommendations.   Return in about 4 weeks (around 09/04/2018) for LR OB f/u, Virtual.   Future Appointments  Date Time Provider [place]  08/07/2018  8:50 AM [facility] [place], DO

## 2018-08-10 ENCOUNTER — Other Ambulatory Visit: Payer: Self-pay | Admitting: *Deleted

## 2018-08-10 DIAGNOSIS — Z3400 Encounter for supervision of normal first pregnancy, unspecified trimester: Secondary | ICD-10-CM

## 2018-08-11 ENCOUNTER — Other Ambulatory Visit: Payer: Self-pay

## 2018-08-11 ENCOUNTER — Other Ambulatory Visit: Payer: BC Managed Care – PPO

## 2018-08-11 DIAGNOSIS — Z3400 Encounter for supervision of normal first pregnancy, unspecified trimester: Secondary | ICD-10-CM

## 2018-08-11 DIAGNOSIS — O24410 Gestational diabetes mellitus in pregnancy, diet controlled: Secondary | ICD-10-CM

## 2018-08-12 LAB — GLUCOSE TOLERANCE, 1 HOUR: Glucose, 1Hr PP: 151 mg/dL (ref 65–199)

## 2018-08-12 NOTE — Progress Notes (Addendum)
Spoke with [employer] from [place] had a 2 hour gtt on 7/10. Lab results are not in the chart. Crystal is calling her supervisor to investigate. Pt threw up prior to being able to complete 2 hour gtt. A 1 hour gtt was drawn with permission from Dr. Nehemiah Settle on 7/14 and it was abnormal and a 3 hour gtt was ordered. Will need to follow up with Provider once 2 hours results are obtained and visible in the chart prior to scheduling the 3 hour gtt.    Crystal has been able to get her 2 hour lab results from 7/10. The results were Fasting: 87 and 1 hour 118.

## 2018-08-13 ENCOUNTER — Telehealth (HOSPITAL_COMMUNITY): Payer: Self-pay | Admitting: Lactation Services

## 2018-08-13 ENCOUNTER — Other Ambulatory Visit: Payer: Self-pay | Admitting: Lactation Services

## 2018-08-13 ENCOUNTER — Telehealth (INDEPENDENT_AMBULATORY_CARE_PROVIDER_SITE_OTHER): Payer: BC Managed Care – PPO | Admitting: Lactation Services

## 2018-08-13 DIAGNOSIS — R7309 Other abnormal glucose: Secondary | ICD-10-CM

## 2018-08-13 MED ORDER — ACCU-CHEK FASTCLIX LANCETS MISC: 1.0000 | Freq: Four times a day (QID) | 12 refills | Status: DC

## 2018-08-13 MED ORDER — ACCU-CHEK GUIDE VI STRP: ORAL_STRIP | 12 refills | Status: DC

## 2018-08-13 MED ORDER — ACCU-CHEK GUIDE W/DEVICE KIT: 1.0000 | PACK | Freq: Once | 0 refills | Status: AC

## 2018-08-13 NOTE — Telephone Encounter (Signed)
Opened in error

## 2018-08-13 NOTE — Telephone Encounter (Signed)
Called and spoke with pt to let her know that her 2nd fasting blood glucose came back high. Informed her that I spoke with Dr. Kennon Rounds and she was ok not doing the 3 hour GTT and to set pt up with Diabetes Education and start her checking her Blood Glucose levels and entering in [employer]. Pt voiced she was worried. Informed her that Gestation Diabetes is through no fault of her own and is a condition where the pregnancy hormaones does not allow Insulin to reach the cells adequately. Discussed she will meet with Diabetes educator and learn how to count carbs and will be shown how to check her blood glucose levels. Pt voiced understanding. Informed her she would be called by the front desk to get her signed up for Diabetes Education and to learn to use her meter.

## 2018-08-13 NOTE — Addendum Note (Signed)
Addended by: Donn Pierini on: 08/13/2018 08:20 AM   Modules accepted: Orders

## 2018-08-13 NOTE — Progress Notes (Signed)
Verified with Dr. Kennon Rounds that repeat fasting blood glucose was 151. She reported she does want patient to begin checking blood sugars. Meter, strips and lancets ordered.

## 2018-08-14 ENCOUNTER — Telehealth: Payer: Self-pay | Admitting: Family Medicine

## 2018-08-14 ENCOUNTER — Telehealth: Payer: Self-pay | Admitting: Lactation Services

## 2018-08-14 NOTE — Telephone Encounter (Signed)
-----   Message from Donn Pierini, RN sent at 08/13/2018  8:20 AM EDT ----- Please call and schedule with Diabetes Education for Meter teaching

## 2018-08-14 NOTE — Telephone Encounter (Signed)
Attempted to call patient to give her an appointment. Left a detailed message for her to call us.

## 2018-08-17 NOTE — Telephone Encounter (Signed)
Looks like she is scheduled for the 30th. Is this too far out?

## 2018-08-18 NOTE — Telephone Encounter (Signed)
Error

## 2018-08-19 ENCOUNTER — Encounter (HOSPITAL_COMMUNITY): Payer: Self-pay

## 2018-08-19 LAB — HIV ANTIBODY (ROUTINE TESTING W REFLEX): HIV Screen 4th Generation wRfx: NONREACTIVE

## 2018-08-19 LAB — CBC
Hematocrit: 34.2 % (ref 34.0–46.6)
Hemoglobin: 11.2 g/dL (ref 11.1–15.9)
MCH: 29.2 pg (ref 26.6–33.0)
MCHC: 32.7 g/dL (ref 31.5–35.7)
MCV: 89 fL (ref 79–97)
Platelets: 274 10*3/uL (ref 150–450)
RBC: 3.83 x10E6/uL (ref 3.77–5.28)
RDW: 12.6 % (ref 11.7–15.4)
WBC: 7 10*3/uL (ref 3.4–10.8)

## 2018-08-19 LAB — RPR: RPR Ser Ql: NONREACTIVE

## 2018-08-19 LAB — GLUCOSE TOLERANCE, 2 HOURS W/ 1HR
Glucose, 1 hour: 118 mg/dL (ref 65–179)
Glucose, 2 hour: 90 mg/dL (ref 65–152)
Glucose, Fasting: 87 mg/dL (ref 65–91)

## 2018-08-26 ENCOUNTER — Other Ambulatory Visit: Payer: Self-pay

## 2018-08-26 ENCOUNTER — Ambulatory Visit (INDEPENDENT_AMBULATORY_CARE_PROVIDER_SITE_OTHER): Payer: Self-pay | Admitting: Pediatrics

## 2018-08-26 DIAGNOSIS — Z7681 Expectant parent(s) prebirth pediatrician visit: Secondary | ICD-10-CM | POA: Insufficient documentation

## 2018-08-26 NOTE — Progress Notes (Signed)
Prenatal counseling for impending newborn done. Mother being followed for GDM.

## 2018-08-27 ENCOUNTER — Other Ambulatory Visit: Payer: Self-pay

## 2018-08-27 ENCOUNTER — Encounter: Payer: Self-pay | Admitting: *Deleted

## 2018-08-27 ENCOUNTER — Ambulatory Visit: Payer: BC Managed Care – PPO | Admitting: *Deleted

## 2018-08-27 ENCOUNTER — Encounter: Payer: BC Managed Care – PPO | Attending: Obstetrics & Gynecology | Admitting: *Deleted

## 2018-08-27 DIAGNOSIS — Z3A00 Weeks of gestation of pregnancy not specified: Secondary | ICD-10-CM | POA: Insufficient documentation

## 2018-08-27 DIAGNOSIS — O24410 Gestational diabetes mellitus in pregnancy, diet controlled: Secondary | ICD-10-CM | POA: Diagnosis not present

## 2018-08-27 NOTE — Progress Notes (Signed)
  Patient was seen on 08/27/2018 for Gestational Diabetes self-management. EDD 10/25/2018. Patient states no history of GDM. Diet history obtained. Patient eats good variety of all food groups. Beverages include water with occasional OJ.  The following learning objectives were met by the patient :   States the definition of Gestational Diabetes  States why dietary management is important in controlling blood glucose  Describes the effects of carbohydrates on blood glucose levels  Demonstrates ability to create a balanced meal plan  Demonstrates carbohydrate counting   States when to check blood glucose levels  Demonstrates proper blood glucose monitoring techniques  States the effect of stress and exercise on blood glucose levels  States the importance of limiting caffeine and abstaining from alcohol and smoking  Plan:  Aim for 3 Carb Choices per meal (45 grams) +/- 1 either way  Aim for 1-2 Carbs per snack Begin reading food labels for Total Carbohydrate of foods If OK with your MD, consider  increasing your activity level by walking, Arm Chair Exercises or other activity daily as tolerated Begin checking BG before breakfast and 2 hours after first bite of breakfast, lunch and dinner as directed by MD  Bring Log Book/Sheet and meter to every medical appointment OR use Baby Scripts (see below) Baby Scripts:  Patient was introduced to [employer] and plans to use as record of BG electronically  Take medication if directed by MD  Patient already has a meter: Accu Chek Guide Patient instructed to test pre breakfast and 2 hours each meal as directed by MD BG today 119 mg/dl 1 hour after breakfast  Patient instructed to monitor glucose levels: FBS: 60 - 95 mg/dl 2 hour: <120 mg/dl  Patient received the following handouts:  Nutrition Diabetes and Pregnancy  Carbohydrate Counting List  Patient will be seen for follow-up as needed.

## 2018-08-28 ENCOUNTER — Encounter: Payer: Self-pay | Admitting: *Deleted

## 2018-09-03 ENCOUNTER — Telehealth: Payer: Self-pay | Admitting: Obstetrics & Gynecology

## 2018-09-03 NOTE — Telephone Encounter (Signed)
Called the patient to inform of the upcoming mychart visit. Left a detailed voicemail message of how to log into mychart and complete the appointment. Informed the patient if she has any questions please call our office at [phone]. °

## 2018-09-04 ENCOUNTER — Other Ambulatory Visit: Payer: Self-pay

## 2018-09-04 ENCOUNTER — Encounter: Payer: Self-pay | Admitting: *Deleted

## 2018-09-04 ENCOUNTER — Telehealth (INDEPENDENT_AMBULATORY_CARE_PROVIDER_SITE_OTHER): Payer: Medicaid Other | Admitting: Family Medicine

## 2018-09-04 ENCOUNTER — Encounter: Payer: Self-pay | Admitting: Family Medicine

## 2018-09-04 ENCOUNTER — Telehealth: Payer: BC Managed Care – PPO | Admitting: Medical

## 2018-09-04 DIAGNOSIS — O24419 Gestational diabetes mellitus in pregnancy, unspecified control: Secondary | ICD-10-CM | POA: Insufficient documentation

## 2018-09-04 DIAGNOSIS — Z3A32 32 weeks gestation of pregnancy: Secondary | ICD-10-CM

## 2018-09-04 DIAGNOSIS — O0990 Supervision of high risk pregnancy, unspecified, unspecified trimester: Secondary | ICD-10-CM

## 2018-09-04 DIAGNOSIS — O0993 Supervision of high risk pregnancy, unspecified, third trimester: Secondary | ICD-10-CM

## 2018-09-04 DIAGNOSIS — O24410 Gestational diabetes mellitus in pregnancy, diet controlled: Secondary | ICD-10-CM

## 2018-09-04 HISTORY — DX: Gestational diabetes mellitus in pregnancy, unspecified control: O24.419

## 2018-09-04 NOTE — Progress Notes (Signed)
   TELEHEALTH OBSTETRICS PRENATAL VIRTUAL VIDEO VISIT ENCOUNTER NOTE  Provider location: Center for [employer] at [facility]   I connected with Graciela Husbands on 09/04/18 at  8:15 AM EDT by MyChart Video Encounter at home and verified that I am speaking with the correct person using two identifiers.   I discussed the limitations, risks, security and privacy concerns of performing an evaluation and management service virtually and the availability of in person appointments. I also discussed with the patient that there may be a patient responsible charge related to this service. The patient expressed understanding and agreed to proceed. Subjective:  Mayvis Agudelo is a 28 y.o. G1P0 at [redacted]w[redacted]d being seen today for ongoing prenatal care.  She is currently monitored for the following issues for this high-risk pregnancy and has Vaginal bleeding before [redacted] weeks gestation; Supervision of high risk pregnancy, antepartum; Pediatric pre-birth visit for expectant parent; and Gestational diabetes on their problem list.  Patient reports no complaints.  Contractions: Irregular. Vag. Bleeding: Other.  Movement: Present. Denies any leaking of fluid.   The following portions of the patient's history were reviewed and updated as appropriate: allergies, current medications, past family history, past medical history, past social history, past surgical history and problem list.   Objective:   Vitals:   09/04/18 0814  BP: 105/67  Pulse: 67    Fetal Status:     Movement: Present     General:  Alert, oriented and cooperative. Patient is in no acute distress.  Respiratory: Normal respiratory effort, no problems with respiration noted  Mental Status: Normal mood and affect. Normal behavior. Normal judgment and thought content.  Rest of physical exam deferred due to type of encounter  Imaging: No results found.  Assessment and Plan:  Pregnancy: G1P0 at [redacted]w[redacted]d 1. Supervision of high risk pregnancy,  antepartum  Bleeding after intercourse over the weekend--labor, bleeding precautions reviewed in detail. Breast pump needed  2. Diet controlled gestational diabetes mellitus (GDM) in third trimester FBS 86-94  2 hour pp 99-119 Few out of range, based on diet--reviewed with her. Will need u/s for growth at 37-38 wks.   Preterm labor symptoms and general obstetric precautions including but not limited to vaginal bleeding, contractions, leaking of fluid and fetal movement were reviewed in detail with the patient. I discussed the assessment and treatment plan with the patient. The patient was provided an opportunity to ask questions and all were answered. The patient agreed with the plan and demonstrated an understanding of the instructions. The patient was advised to call back or seek an in-person office evaluation/go to MAU at [facility] for any urgent or concerning symptoms. Please refer to After Visit Summary for other counseling recommendations.   I provided 22 minutes of face-to-face time during this encounter.  Return in 2 weeks (on 09/18/2018) for virtual.  No future appointments.  Donnamae Jude, MD Center for [employer], [place]

## 2018-09-04 NOTE — Progress Notes (Signed)
I connected with  Geanette Buonocore on 09/04/18 at  8:15 AM EDT by telephone and verified that I am speaking with the correct person using two identifiers.   I discussed the limitations, risks, security and privacy concerns of performing an evaluation and management service by telephone and the availability of in person appointments. I also discussed with the patient that there may be a patient responsible charge related to this service. The patient expressed understanding and agreed to proceed.  C/o significant pelvic pressure , lot of contractions - often as 10 minutes throughout the day and were not painful, but uncomfortable. States wasn't doing anything, just resting. Denies any today.  C/o dark brown spotting earlier in the week.   Linda,RN 09/04/2018  8:10 AM

## 2018-09-04 NOTE — Patient Instructions (Signed)

## 2018-09-18 ENCOUNTER — Other Ambulatory Visit: Payer: Self-pay

## 2018-09-18 ENCOUNTER — Encounter (HOSPITAL_COMMUNITY): Payer: Self-pay

## 2018-09-18 ENCOUNTER — Inpatient Hospital Stay (HOSPITAL_COMMUNITY)
Admission: AD | Admit: 2018-09-18 | Discharge: 2018-09-19 | DRG: 807 | Disposition: A | Discharge status: Home / Self Care | Payer: Medicaid Other | Attending: Obstetrics and Gynecology | Admitting: Obstetrics and Gynecology

## 2018-09-18 DIAGNOSIS — O99824 Streptococcus B carrier state complicating childbirth: Secondary | ICD-10-CM | POA: Diagnosis present

## 2018-09-18 DIAGNOSIS — O42013 Preterm premature rupture of membranes, onset of labor within 24 hours of rupture, third trimester: Secondary | ICD-10-CM

## 2018-09-18 DIAGNOSIS — Z20828 Contact with and (suspected) exposure to other viral communicable diseases: Secondary | ICD-10-CM | POA: Diagnosis present

## 2018-09-18 DIAGNOSIS — Z3A34 34 weeks gestation of pregnancy: Secondary | ICD-10-CM

## 2018-09-18 DIAGNOSIS — Z283 Underimmunization status: Secondary | ICD-10-CM

## 2018-09-18 DIAGNOSIS — O24420 Gestational diabetes mellitus in childbirth, diet controlled: Secondary | ICD-10-CM | POA: Diagnosis present

## 2018-09-18 DIAGNOSIS — O24419 Gestational diabetes mellitus in pregnancy, unspecified control: Secondary | ICD-10-CM | POA: Diagnosis present

## 2018-09-18 DIAGNOSIS — O42919 Preterm premature rupture of membranes, unspecified as to length of time between rupture and onset of labor, unspecified trimester: Secondary | ICD-10-CM | POA: Diagnosis present

## 2018-09-18 DIAGNOSIS — O09899 Supervision of other high risk pregnancies, unspecified trimester: Secondary | ICD-10-CM

## 2018-09-18 DIAGNOSIS — O42913 Preterm premature rupture of membranes, unspecified as to length of time between rupture and onset of labor, third trimester: Secondary | ICD-10-CM | POA: Diagnosis present

## 2018-09-18 DIAGNOSIS — O24410 Gestational diabetes mellitus in pregnancy, diet controlled: Secondary | ICD-10-CM

## 2018-09-18 DIAGNOSIS — O24013 Pre-existing diabetes mellitus, type 1, in pregnancy, third trimester: Secondary | ICD-10-CM | POA: Diagnosis not present

## 2018-09-18 DIAGNOSIS — Z2839 Other underimmunization status: Secondary | ICD-10-CM

## 2018-09-18 DIAGNOSIS — O0990 Supervision of high risk pregnancy, unspecified, unspecified trimester: Secondary | ICD-10-CM

## 2018-09-18 LAB — URINALYSIS, ROUTINE W REFLEX MICROSCOPIC
Bilirubin Urine: NEGATIVE
Glucose, UA: NEGATIVE mg/dL
Hgb urine dipstick: NEGATIVE
Ketones, ur: NEGATIVE mg/dL
Nitrite: NEGATIVE
Protein, ur: NEGATIVE mg/dL
Specific Gravity, Urine: 1.009 (ref 1.005–1.030)
pH: 8 (ref 5.0–8.0)

## 2018-09-18 LAB — AMNISURE RUPTURE OF MEMBRANE (ROM) NOT AT ARMC: Amnisure ROM: POSITIVE

## 2018-09-18 LAB — CBC
HCT: 34.5 % — ABNORMAL LOW (ref 36.0–46.0)
Hemoglobin: 11.7 g/dL — ABNORMAL LOW (ref 12.0–15.0)
MCH: 29.3 pg (ref 26.0–34.0)
MCHC: 33.9 g/dL (ref 30.0–36.0)
MCV: 86.5 fL (ref 80.0–100.0)
Platelets: 230 10*3/uL (ref 150–400)
RBC: 3.99 MIL/uL (ref 3.87–5.11)
RDW: 13.2 % (ref 11.5–15.5)
WBC: 7.7 10*3/uL (ref 4.0–10.5)
nRBC: 0 % (ref 0.0–0.2)

## 2018-09-18 LAB — TYPE AND SCREEN
ABO/RH(D): O POS
Antibody Screen: NEGATIVE

## 2018-09-18 LAB — GROUP B STREP BY PCR: Group B strep by PCR: POSITIVE — AB

## 2018-09-18 LAB — GLUCOSE, CAPILLARY: Glucose-Capillary: 85 mg/dL (ref 70–99)

## 2018-09-18 LAB — OB RESULTS CONSOLE GBS: GBS: POSITIVE

## 2018-09-18 LAB — RPR: RPR Ser Ql: NONREACTIVE

## 2018-09-18 LAB — SARS CORONAVIRUS 2 BY RT PCR (HOSPITAL ORDER, PERFORMED IN ~~LOC~~ HOSPITAL LAB): SARS Coronavirus 2: NEGATIVE

## 2018-09-18 MED ORDER — ONDANSETRON HCL 4 MG/2ML IJ SOLN: 4.0000 mg | Freq: Four times a day (QID) | INTRAMUSCULAR | Status: DC | PRN

## 2018-09-18 MED ORDER — OXYCODONE-ACETAMINOPHEN 5-325 MG PO TABS: 1.0000 | ORAL_TABLET | ORAL | Status: DC | PRN

## 2018-09-18 MED ORDER — COCONUT OIL OIL: 1.0000 "application " | TOPICAL_OIL | Status: DC | PRN

## 2018-09-18 MED ORDER — EPHEDRINE 5 MG/ML INJ: 10.0000 mg | INTRAVENOUS | Status: DC | PRN

## 2018-09-18 MED ORDER — ACETAMINOPHEN 325 MG PO TABS: 650.0000 mg | ORAL_TABLET | ORAL | Status: DC | PRN

## 2018-09-18 MED ORDER — OXYCODONE-ACETAMINOPHEN 5-325 MG PO TABS: 2.0000 | ORAL_TABLET | ORAL | Status: DC | PRN

## 2018-09-18 MED ORDER — OXYTOCIN 40 UNITS IN NORMAL SALINE INFUSION - SIMPLE MED
1.0000 m[IU]/min | INTRAVENOUS | Status: DC
  Administered 2018-09-18: 09:00:00 2 m[IU]/min via INTRAVENOUS
  Filled 2018-09-18: qty 1000

## 2018-09-18 MED ORDER — FENTANYL-BUPIVACAINE-NACL 0.5-0.125-0.9 MG/250ML-% EP SOLN: 12.0000 mL/h | EPIDURAL | Status: DC | PRN

## 2018-09-18 MED ORDER — PENICILLIN G 3 MILLION UNITS IVPB - SIMPLE MED: 3.0000 10*6.[IU] | INTRAVENOUS | Status: DC

## 2018-09-18 MED ORDER — TETANUS-DIPHTH-ACELL PERTUSSIS 5-2.5-18.5 LF-MCG/0.5 IM SUSP: 0.5000 mL | Freq: Once | INTRAMUSCULAR | Status: DC

## 2018-09-18 MED ORDER — PHENYLEPHRINE 40 MCG/ML (10ML) SYRINGE FOR IV PUSH (FOR BLOOD PRESSURE SUPPORT): 80.0000 ug | PREFILLED_SYRINGE | INTRAVENOUS | Status: DC | PRN

## 2018-09-18 MED ORDER — MEASLES, MUMPS & RUBELLA VAC IJ SOLR
0.5000 mL | Freq: Once | INTRAMUSCULAR | Status: AC
  Administered 2018-09-19: 0.5 mL via SUBCUTANEOUS
  Filled 2018-09-18: qty 0.5

## 2018-09-18 MED ORDER — WITCH HAZEL-GLYCERIN EX PADS: 1.0000 "application " | MEDICATED_PAD | CUTANEOUS | Status: DC | PRN

## 2018-09-18 MED ORDER — LIDOCAINE HCL (PF) 1 % IJ SOLN: 30.0000 mL | INTRAMUSCULAR | Status: DC | PRN

## 2018-09-18 MED ORDER — DIPHENHYDRAMINE HCL 50 MG/ML IJ SOLN: 12.5000 mg | INTRAMUSCULAR | Status: DC | PRN

## 2018-09-18 MED ORDER — OXYTOCIN BOLUS FROM INFUSION
500.0000 mL | Freq: Once | INTRAVENOUS | Status: AC
  Administered 2018-09-18: 500 mL via INTRAVENOUS

## 2018-09-18 MED ORDER — FENTANYL CITRATE (PF) 100 MCG/2ML IJ SOLN: 100.0000 ug | INTRAMUSCULAR | Status: DC | PRN

## 2018-09-18 MED ORDER — PENICILLIN G 3 MILLION UNITS IVPB - SIMPLE MED
3.0000 10*6.[IU] | INTRAVENOUS | Status: DC
  Administered 2018-09-18: 3 10*6.[IU] via INTRAVENOUS
  Filled 2018-09-18 (×8): qty 100

## 2018-09-18 MED ORDER — LACTATED RINGERS IV SOLN: 500.0000 mL | Freq: Once | INTRAVENOUS | Status: DC

## 2018-09-18 MED ORDER — LACTATED RINGERS IV SOLN: 500.0000 mL | INTRAVENOUS | Status: DC | PRN

## 2018-09-18 MED ORDER — ACETAMINOPHEN 325 MG PO TABS
650.0000 mg | ORAL_TABLET | ORAL | Status: DC | PRN
  Administered 2018-09-18: 650 mg via ORAL
  Filled 2018-09-18: qty 2

## 2018-09-18 MED ORDER — BETAMETHASONE SOD PHOS & ACET 6 (3-3) MG/ML IJ SUSP
12.0000 mg | INTRAMUSCULAR | Status: DC
  Administered 2018-09-18: 08:00:00 12 mg via INTRAMUSCULAR
  Filled 2018-09-18 (×3): qty 2

## 2018-09-18 MED ORDER — ZOLPIDEM TARTRATE 5 MG PO TABS: 5.0000 mg | ORAL_TABLET | Freq: Every evening | ORAL | Status: DC | PRN

## 2018-09-18 MED ORDER — NIFEDIPINE 10 MG PO CAPS
10.0000 mg | ORAL_CAPSULE | ORAL | Status: DC | PRN
  Administered 2018-09-18: 10 mg via ORAL
  Filled 2018-09-18: qty 1

## 2018-09-18 MED ORDER — IBUPROFEN 600 MG PO TABS
600.0000 mg | ORAL_TABLET | Freq: Four times a day (QID) | ORAL | Status: DC
  Filled 2018-09-18 (×2): qty 1

## 2018-09-18 MED ORDER — BENZOCAINE-MENTHOL 20-0.5 % EX AERO
1.0000 "application " | INHALATION_SPRAY | CUTANEOUS | Status: DC | PRN
  Administered 2018-09-19: 1 via TOPICAL
  Filled 2018-09-18: qty 56

## 2018-09-18 MED ORDER — DIBUCAINE (PERIANAL) 1 % EX OINT: 1.0000 "application " | TOPICAL_OINTMENT | CUTANEOUS | Status: DC | PRN

## 2018-09-18 MED ORDER — SOD CITRATE-CITRIC ACID 500-334 MG/5ML PO SOLN: 30.0000 mL | ORAL | Status: DC | PRN

## 2018-09-18 MED ORDER — OXYCODONE HCL 5 MG PO TABS: 5.0000 mg | ORAL_TABLET | ORAL | Status: DC | PRN

## 2018-09-18 MED ORDER — LACTATED RINGERS IV SOLN
INTRAVENOUS | Status: DC
  Administered 2018-09-18: 07:00:00 via INTRAVENOUS

## 2018-09-18 MED ORDER — ONDANSETRON HCL 4 MG/2ML IJ SOLN: 4.0000 mg | INTRAMUSCULAR | Status: DC | PRN

## 2018-09-18 MED ORDER — OXYTOCIN 40 UNITS IN NORMAL SALINE INFUSION - SIMPLE MED: 2.5000 [IU]/h | INTRAVENOUS | Status: DC

## 2018-09-18 MED ORDER — DIPHENHYDRAMINE HCL 25 MG PO CAPS: 25.0000 mg | ORAL_CAPSULE | Freq: Four times a day (QID) | ORAL | Status: DC | PRN

## 2018-09-18 MED ORDER — LACTATED RINGERS IV SOLN
INTRAVENOUS | Status: DC
  Administered 2018-09-18: 05:00:00 via INTRAVENOUS

## 2018-09-18 MED ORDER — TERBUTALINE SULFATE 1 MG/ML IJ SOLN: 0.2500 mg | Freq: Once | INTRAMUSCULAR | Status: DC | PRN

## 2018-09-18 MED ORDER — ONDANSETRON HCL 4 MG PO TABS: 4.0000 mg | ORAL_TABLET | ORAL | Status: DC | PRN

## 2018-09-18 MED ORDER — SODIUM CHLORIDE 0.9 % IV SOLN
5.0000 10*6.[IU] | Freq: Once | INTRAVENOUS | Status: AC
  Administered 2018-09-18: 5 10*6.[IU] via INTRAVENOUS
  Filled 2018-09-18: qty 5

## 2018-09-18 MED ORDER — PRENATAL MULTIVITAMIN CH
1.0000 | ORAL_TABLET | Freq: Every day | ORAL | Status: DC
  Filled 2018-09-18: qty 1

## 2018-09-18 MED ORDER — BREAST MILK/FORMULA (FOR LABEL PRINTING ONLY): ORAL | Status: DC

## 2018-09-18 MED ORDER — SIMETHICONE 80 MG PO CHEW: 80.0000 mg | CHEWABLE_TABLET | ORAL | Status: DC | PRN

## 2018-09-18 MED ORDER — SENNOSIDES-DOCUSATE SODIUM 8.6-50 MG PO TABS
2.0000 | ORAL_TABLET | ORAL | Status: DC
  Administered 2018-09-19: 10:00:00 2 via ORAL
  Filled 2018-09-18 (×2): qty 2

## 2018-09-18 NOTE — Lactation Note (Addendum)
This note was copied from a baby's chart. Lactation Consultation Note  Patient Name: Elaine Lewis CBSWH'Q Date: 09/18/2018 Reason for consult: Initial assessment;NICU baby;Late-preterm 34-36.6wks   Mom and dad awake, mom pumping when LC entered.  Infant born today at 34wk 5 days.  Mom  collected approx. 3 ml.  Mom very tired.  States she really wants to breastfeed and provide breastmilk to her infant.  [facility] taught mom hand expression and mom returned demonstration.  Mom had breasts changes during pregnancy.  Has already pumped once today; infant now 6 hours of age.    LC reviewed NICU booklet with mom.  DEBP set up, cleaning of parts, and collecting and storing ebm reviewed.  Mom has colostrum containers to collect.  Mom is using 24 size flange and these work well.   Plan: mom to pump with DEBP  Every 2-3 hours during the day and 4 hours at night. DEBP at least 8 times in 24 hours.  Mom is encouraged to hand express prior to and after pumping.    Mom was very excited to see the amount pumped this session.  LC encouraged her to log pumped amounts.    All questions answered at this time.  Parents were encouraged to call out for assistance of further questions.    [facility] brochure and out patient services shared along with support group info.  WIC pump referral faxed.   Maternal Data Formula Feeding for Exclusion: No Has patient been taught Hand Expression?: Yes Does the patient have breastfeeding experience prior to this delivery?: No  Feeding Feeding Type: Donor Breast Milk  LATCH Score                   Interventions    Lactation Tools Discussed/Used WIC Program: Yes Pump Review: Setup, frequency, and cleaning;Milk Storage Date initiated:: 09/18/18   Consult Status      Ferne Coe [facility] 09/18/2018, 8:29 PM

## 2018-09-18 NOTE — Consult Note (Signed)
Neonatology Consult  Note:  At the request of K. Brigitte Pulse - CNM I met with Graciela Husbands and FOB who is at 34 5 weeks and presented with PPROM.  Pregancy complicated by [id number], Rubella non-immune.  She has received one dose of betamethasone thus far. We reviewed initial delivery room management, including CPAP, Sunnyslope, and low but certainly possible need for intubation for surfactant administration.  We discussed feeding immaturity and need for full po intake with multiple days of good weight gain and no apnea or bradycardia before discharge.  We reviewed increased risk of jaundice, infection, and temperature instability.   Discussed likely length of stay.  Thank you for allowing [place] to participate in her care.  Please call with questions.  Higinio Roger, DO  Neonatologist  The total length of face-to-face or floor / unit time for this encounter was 25 minutes.  Counseling and / or coordination of care was greater than fifty percent of the time.

## 2018-09-18 NOTE — Progress Notes (Signed)
Patient ID: Elaine Lewis, female   DOB: 1990/09/26, 28 y.o.   MRN: [id number]  Pitocin started at approx 0900; feeling some cramping but not super uncomfortable; had questions answered by Dr Higinio Roger (Ne[place]) and feels much more prepared; PCN x 2 doses; BMZ x 1 dose  BP 111/81, P 63 FHR 130s, +accels, no decels, Cat 1 Ctx q 2-3 mins with Pit at 75mu/min AROM of forebag for clear fluid  IUP@34 .5wks PPROM/PTL GBS PCR positive  Anticipate labor increasing now that membranes are fully ruptured  Myrtis Ser CNM 09/18/2018

## 2018-09-18 NOTE — MAU Note (Signed)
CRITICAL VALUE ALERT  Critical Value:  GBS+   Date & Time Notied:  09/18/18  Provider Notified: Hansel Feinstein CNM

## 2018-09-18 NOTE — MAU Note (Signed)
Pt reporting Ctx since midnight. Denied LOF or VB. +FM. GDM- diet controlled.

## 2018-09-18 NOTE — H&P (Signed)
LABOR AND DELIVERY ADMISSION HISTORY AND PHYSICAL NOTE  Elaine Lewis is a 28 y.o. female G1P0 with IUP at 51w5dby 10wk [place]ore[place]resenting for preterm labor and questionable leaking since uncertain time.  She reports positive fetal movement. She denies vaginal bleeding.  [facility]  - [id number]- Rubella non-immune  Past Medical History: Past Medical History:  Diagnosis Date  . Gastritis    As a child  . Gastritis    age 89 . Medical history non-contributory     Past Surgical History: Past Surgical History:  Procedure Laterality Date  . NO PAST SURGERIES      Obstetrical History: OB History    Gravida  1   Para      Term      Preterm      AB      Living        SAB      TAB      Ectopic      Multiple      Live Births              Social History: Social History   Socioeconomic History  . Marital status: Single    Spouse name: Not on file  . Number of children: Not on file  . Years of education: Not on file  . 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 Financial resource strain: Not on file  . Food insecurity    Worry: Never true    Inability: Never true  . Transportation needs    Medical: No    Non-medical: No  Tobacco Use  . Smoking status: Never Smoker  . Smokeless tobacco: Never Used  Substance and Sexual Activity  . Alcohol use: Not Currently    Frequency: Never  . Drug use: Never  . Sexual activity: Yes    Birth control/protection: None  Lifestyle  . Physical activity    Days per week: Not on file    Minutes per session: Not on file  . Stress: Not on file  Relationships  . Social [place] phone: Not on file    Gets together: Not on file    Attends religious service: Not on file    Active member of club or organization: Not on file    Attends meetings of clubs or  organizations: Not on file    Relationship status: Not on file  Other Topics Concern  . Not on file  Social History Narrative  . Not on file    Family History: Family History  Problem Relation Age of Onset  . Hyperlipidemia Mother   . Thyroid disease Mother   . Gout Father     Allergies: No Known Allergies  Medications Prior to Admission  Medication Sig Dispense Refill Last Dose  . Calcium-Phosphorus-Vitamin D (CALCIUM GUMMIES PO) Take 2 tablets by mouth daily.   09/17/2018 at Unknown time  . FERROUS SULFATE PO Take 1 tablet by mouth 2 (two) times daily.   09/17/2018 at Unknown time  . Prenatal Vit-Fe Fumarate-FA (MULTIVITAMIN-PRENATAL) 27-0.8 MG TABS tablet Take 1 tablet by mouth daily at 12 noon.   09/17/2018 at Unknown time  . Accu-Chek FastClix Lancets MISC 1 Device by Percutaneous route 4 (four) times daily. Check fasting Blood Glucose in the morning and then 2 hours after breakfast, Lunch  and dinner. 100 each 12   . glucose blood (ACCU-CHEK GUIDE) test strip Use as instructed 100 each 12   . nitrofurantoin, macrocrystal-monohydrate, (MACROBID) 100 MG capsule Take 1 capsule (100 mg total) by mouth 2 (two) times daily. (Patient not taking: Reported on 07/09/2018) 14 capsule 0      Review of Systems  All systems reviewed and negative except as stated in HPI  Physical Exam Blood pressure 116/76, pulse 74, temperature (!) 97.5 F (36.4 C), temperature source Oral, resp. rate 19, weight 72.6 kg, last menstrual period 01/07/2018, SpO2 99 %. General appearance: alert, oriented, NAD Lungs: normal respiratory effort Heart: regular rate Abdomen: soft, non-tender; gravid, FH appropriate for GA Extremities: No calf swelling or tenderness Presentation: cephalic Fetal monitoring: 130s, +accels Uterine activity: spacing out Dilation: 3.5 Effacement (%): 100 Station: 0 Exam by:: Carmelia Roller, CNM  Prenatal labs: ABO, Rh: O/Positive/-- (03/20 1003) Antibody: Negative (03/20  1003) Rubella: <0.90 (03/20 1003) RPR: Non Reactive (07/10 0822)  HBsAg: Negative (03/20 1003)  HIV: Non Reactive (07/10 0822)  GC/Chlamydia: neg/neg 06/26/2018, needs 3rd tri screen GBS:   unknown 2-hr GTT: abnormal fasting but refused 3 hour, treated as A1GDM Genetic screening:  Normal horizon/panorama Anatomy US: normal  Prenatal Transfer Tool  Maternal Diabetes: Yes:  Diabetes Type:  Diet controlled Genetic Screening: Normal Maternal Ultrasounds/Referrals: Normal Fetal Ultrasounds or other Referrals:  None Maternal Substance Abuse:  No Significant Maternal Medications:  None Significant Maternal Lab Results: Other: GBS PCR pending (will prophylax)  Results for orders placed or performed during the hospital encounter of 09/18/18 (from the past 24 hour(s))  Urinalysis, Routine w reflex microscopic   Collection Time: 09/18/18  3:38 AM  Result Value Ref Range   Color, Urine YELLOW YELLOW   APPearance HAZY (A) CLEAR   Specific Gravity, Urine 1.009 1.005 - 1.030   pH 8.0 5.0 - 8.0   Glucose, UA NEGATIVE NEGATIVE mg/dL   Hgb urine dipstick NEGATIVE NEGATIVE   Bilirubin Urine NEGATIVE NEGATIVE   Ketones, ur NEGATIVE NEGATIVE mg/dL   Protein, ur NEGATIVE NEGATIVE mg/dL   Nitrite NEGATIVE NEGATIVE   Leukocytes,Ua LARGE (A) NEGATIVE   RBC / HPF 0-5 0 - 5 RBC/hpf   WBC, UA 21-50 0 - 5 WBC/hpf   Bacteria, UA MANY (A) NONE SEEN   Squamous Epithelial / LPF 6-10 0 - 5   Mucus PRESENT    Sperm, UA PRESENT   Amnisure rupture of membrane (rom)not at [facility]   Collection Time: 09/18/18  4:16 AM  Result Value Ref Range   Amnisure ROM POSITIVE     Patient Active Problem List   Diagnosis Date Noted  . Gestational diabetes 09/04/2018  . Pediatric pre-birth visit for expectant parent 08/26/2018  . Supervision of high risk pregnancy, antepartum 04/17/2018  . Vaginal bleeding before [redacted] weeks gestation 02/27/2018    Assessment: Elaine Lewis is a 27 y.o. G1P0 at 30w5dhere for PPROM with  preterm labor.   #Pre-term Labor/PPROM:  Amniosure positive for ROM at 349w5dwill proceed to delivery and augment PRN. Give betamethasone dose #1. #GDM: fingersticks q4h with LISS in latent labor, q2h in active labor #Pain: Would prefer no pain meds #ID: Preterm with GBS unknown, start penicillin. Collect 3rd trimester GN/CT. Needs MMR PP for rubella non-immune status.  #MOF: breast #MOC: undecided #Circ:  n/a  MaClarnce Flock/21/2020, 5:23 AM  Agree with the above and information re fetal monitoring filled in by me.  KiMyrtis SerNM  09/18/2018  

## 2018-09-18 NOTE — Discharge Summary (Signed)
Postpartum Discharge Summary     Patient Name: Elaine Lewis DOB: 01-26-1991 MRN: [id number]  Date of admission: 09/18/2018 Delivering Provider: Serita Grammes D   Date of discharge: 09/19/2018  Admitting diagnosis: 34wks ctx Intrauterine pregnancy: [redacted]w[redacted]d    Secondary diagnosis:  Active Problems:   Gestational diabetes   Preterm premature rupture of membranes (PPROM) with unknown onset of labor   Rubella non-immune status, antepartum  Additional problems: none     Discharge diagnosis: Preterm Pregnancy Delivered                                                                                                Post partum procedures:MMR offered  Augmentation: Pitocin and AROM of forebag  Complications: None  Hospital course:  Induction of Labor With Vaginal Delivery   28y.o. yo G1P0101 at 327w5das admitted to the hospital 09/18/2018 for induction of labor.  Indication for induction: preterm PROM at unknown time. She received Pitocin for augmentation, a dose of betamethasone, and 2 doses of PCN for GBS prophylaxis prior to delivery. Patient had an uncomplicated labor course as follows: Membrane Rupture Time/Date: 3:00 AM ,09/18/2018   Intrapartum Procedures: Episiotomy: None [1]                                         Lacerations:  None [1]  Patient had delivery of a Viable infant.  Information for the patient's newborn:  VaFradel, Baldonado0[[id number]]    09/18/2018  Details of delivery can be found in separate delivery note.  Patient had a routine postpartum course. Patient is discharged home 09/19/18.  Magnesium Sulfate recieved: No BMZ received: Yes x 1 dose  Physical exam  Vitals:   09/18/18 2025 09/19/18 0036 09/19/18 0531 09/19/18 0800  BP: (!) 105/59 97/65 (!) 116/56 111/61  Pulse: 68 77 60 85  Resp: 18 18 17 18   Temp: 98.2 F (36.8 C) 98.3 F (36.8 C) 98.1 F (36.7 C) 98.2 F (36.8 C)  TempSrc: Oral Oral Oral Oral  SpO2: 100% 100% 100% 96%  Weight:       Height:       General: alert, cooperative and no distress Lochia: appropriate Uterine Fundus: firm Incision: Healing well with no significant drainage DVT Evaluation: No evidence of DVT seen on physical exam. Labs: Lab Results  Component Value Date   WBC 7.7 09/18/2018   HGB 11.7 (L) 09/18/2018   HCT 34.5 (L) 09/18/2018   MCV 86.5 09/18/2018   PLT 230 09/18/2018   No flowsheet data found.  Discharge instruction: per After Visit Summary and "Baby and Me Booklet".  After visit meds:  Allergies as of 09/19/2018   No Known Allergies     Medication List    STOP taking these medications   Accu-Chek FastClix Lancets Misc   Accu-Chek Guide test strip Generic drug: glucose blood   CALCIUM GUMMIES PO   FERROUS SULFATE PO   multivitamin-prenatal 27-0.8 MG Tabs tablet   nitrofurantoin (  macrocrystal-monohydrate) 100 MG capsule Commonly known as: MACROBID     TAKE these medications   ibuprofen 600 MG tablet Commonly known as: ADVIL Take 1 tablet (600 mg total) by mouth every 6 (six) hours.   oxyCODONE 5 MG immediate release tablet Commonly known as: Oxy IR/ROXICODONE Take 1 tablet (5 mg total) by mouth every 4 (four) hours as needed (pain scale 4-7).      Diet: routine diet  Activity: Advance as tolerated. Pelvic rest for 6 weeks.   Outpatient follow up:6 weeks with GTT Follow up Appt: Future Appointments  Date Time Provider [place]  09/23/2018  4:15 PM Donnamae Jude, MD [facility]   Follow up Visit: [facility]. Schedule an appointment as soon as possible for a visit in 4 week(s).   Specialty: Obstetrics and Gynecology Contact information: [address], [place] [id number] [place] [id number] [phone]         Please schedule this patient for Postpartum visit in: 6 weeks with the following provider: Any provider For C/S patients schedule nurse  incision check in weeks 2 weeks: no High risk pregnancy complicated by: GDMA1, preterm delivery Delivery mode:  SVD Anticipated Birth Control:  other/unsure PP Procedures needed: 2 hour GTT  Schedule Integrated [place] visit: no  Newborn Data: Live born female  Birth Weight: 5 lb 0.1 oz (2270 g) APGAR: 9, 9  Newborn Delivery   Birth date/time: 09/18/2018 13:54:00 Delivery type: Vaginal, Spontaneous      Baby Feeding: Breast Disposition:NICU   09/19/2018 Sloan Leiter, MD

## 2018-09-18 NOTE — MAU Note (Signed)
Pt. Reported to MAU with contractions since midnight 09/18/18 every 3 minutes rating them 6/10. No vaginal bleeding or discharge. +FM (decreased since contractions began at midnight)

## 2018-09-18 NOTE — Plan of Care (Signed)
  Problem: Education: Goal: Knowledge of condition will improve Outcome: Progressing   

## 2018-09-18 NOTE — MAU Provider Note (Signed)
Chief Complaint:  No chief complaint on file.   First Provider Initiated Contact with Patient 09/18/18 [phone]      HPI: Elaine Lewis is a 28 y.o. G1P0 at 62w5dwho presents to maternity admissions reporting painful contractions. . She reports good fetal movement, denies LOF, vaginal bleeding, vaginal itching/burning, urinary symptoms, h/a, dizziness, n/v, diarrhea, constipation or fever/chills.    Pregnancy has been followed at [place]  Patient Active Problem List   Diagnosis Date Noted  . Gestational diabetes 09/04/2018  . Pediatric pre-birth visit for expectant parent 08/26/2018  . Supervision of high risk pregnancy, antepartum 04/17/2018  . Vaginal bleeding before [redacted] weeks gestation 02/27/2018     RN Note: Pt reporting Ctx since midnight. Denied LOF or VB. +FM. GDM- diet controlled  Past Medical History: Past Medical History:  Diagnosis Date  . Gastritis    As a child  . Gastritis    age 80  . Medical history non-contributory     Past obstetric history: OB History  Gravida Para Term Preterm AB Living  1            SAB TAB Ectopic Multiple Live Births               # Outcome Date GA Lbr Len/2nd Weight Sex Delivery Anes PTL Lv  1 Current             Past Surgical History: Past Surgical History:  Procedure Laterality Date  . NO PAST SURGERIES      Family History: Family History  Problem Relation Age of Onset  . Hyperlipidemia Mother   . Thyroid disease Mother   . Gout Father     Social History: Social History   Tobacco Use  . Smoking status: Never Smoker  . Smokeless tobacco: Never Used  Substance Use Topics  . Alcohol use: Not Currently    Frequency: Never  . Drug use: Never    Allergies: No Known Allergies  Meds:  Medications Prior to Admission  Medication Sig Dispense Refill Last Dose  . Calcium-Phosphorus-Vitamin D (CALCIUM GUMMIES PO) Take 2 tablets by mouth daily.   09/17/2018 at Unknown time  . FERROUS SULFATE PO Take 1 tablet by mouth 2  (two) times daily.   09/17/2018 at Unknown time  . Prenatal Vit-Fe Fumarate-FA (MULTIVITAMIN-PRENATAL) 27-0.8 MG TABS tablet Take 1 tablet by mouth daily at 12 noon.   09/17/2018 at Unknown time  . Accu-Chek FastClix Lancets MISC 1 Device by Percutaneous route 4 (four) times daily. Check fasting Blood Glucose in the morning and then 2 hours after breakfast, Lunch and dinner. 100 each 12   . glucose blood (ACCU-CHEK GUIDE) test strip Use as instructed 100 each 12   . nitrofurantoin, macrocrystal-monohydrate, (MACROBID) 100 MG capsule Take 1 capsule (100 mg total) by mouth 2 (two) times daily. (Patient not taking: Reported on 07/09/2018) 14 capsule 0     I have reviewed patient's Past Medical Hx, Surgical Hx, Family Hx, Social Hx, medications and allergies.   ROS:  Review of Systems  Constitutional: Negative for chills and fever.  Respiratory: Negative for shortness of breath.   Gastrointestinal: Positive for abdominal pain. Negative for constipation, diarrhea, nausea and vomiting.  Genitourinary: Positive for pelvic pain. Negative for vaginal bleeding and vaginal discharge.  Musculoskeletal: Positive for back pain.   Other systems negative  Physical Exam   Patient Vitals for the past 24 hrs:  BP Temp Temp src Pulse Resp SpO2 Weight  09/18/18 0340 116/76 - -  74 - 99 % -  09/18/18 0322 130/70 (!) 97.5 F (36.4 C) Oral 72 19 - 72.6 kg   Constitutional: Well-developed, well-nourished female in no acute distress.  Cardiovascular: normal rate and rhythm Respiratory: normal effort, clear to auscultation bilaterally GI: Abd soft, non-tender, gravid appropriate for gestational age.   No rebound or guarding. MS: Extremities nontender, no edema, normal ROM Neurologic: Alert and oriented x 4.  GU: Neg CVAT.  PELVIC EXAM:  Dilation: 3.5 Effacement (%): 100 Station: 0 Presentation: Vertex Exam by:: Elaine Lewis, CNM Hair felt during exam.  No membranes palpable  FHT:  Baseline 135 , moderate  variability, accelerations present, no decelerations Contractions: q 2-3 mins Irregular     Labs: O/Positive/-- (03/20 1003) Results for orders placed or performed during the hospital encounter of 09/18/18 (from the past 24 hour(s))  Urinalysis, Routine w reflex microscopic     Status: Abnormal   Collection Time: 09/18/18  3:38 AM  Result Value Ref Range   Color, Urine YELLOW YELLOW   APPearance HAZY (A) CLEAR   Specific Gravity, Urine 1.009 1.005 - 1.030   pH 8.0 5.0 - 8.0   Glucose, UA NEGATIVE NEGATIVE mg/dL   Hgb urine dipstick NEGATIVE NEGATIVE   Bilirubin Urine NEGATIVE NEGATIVE   Ketones, ur NEGATIVE NEGATIVE mg/dL   Protein, ur NEGATIVE NEGATIVE mg/dL   Nitrite NEGATIVE NEGATIVE   Leukocytes,Ua LARGE (A) NEGATIVE   RBC / HPF 0-5 0 - 5 RBC/hpf   WBC, UA 21-50 0 - 5 WBC/hpf   Bacteria, UA MANY (A) NONE SEEN   Squamous Epithelial / LPF 6-10 0 - 5   Mucus PRESENT    Sperm, UA PRESENT   Amnisure [facility] (rom)[facility]     Status: None   Collection Time: 09/18/18  4:16 AM  Result Value Ref Range   Amnisure ROM POSITIVE     Imaging:  No results found.  MAU Course/MDM: I have ordered labs and reviewed results. Amnisure is positive NST reviewed, reassuring Procardia series started, and did not slow contractions IV fluids started for hydration Consult Dr Jolayne Pantheronstant with presentation, exam findings and test results.    Assessment: Single intrauterine pregnancy at 6660w5d Preterm Premature Rupture of Membranes Preterm Labor Gestational Diabetes, Diet controlled GBS positive  Plan: Admit to Labor and Delivery Routine orders Betamethasone series GBS prophylaxis. Delivery team to follow   [name] CNM, MSN Certified Nurse-Midwife 09/18/2018 4:52 AM

## 2018-09-19 LAB — ABO/RH: ABO/RH(D): O POS

## 2018-09-19 MED ORDER — OXYCODONE HCL 5 MG PO TABS: 5.0000 mg | ORAL_TABLET | ORAL | 0 refills | Status: AC | PRN

## 2018-09-19 MED ORDER — IBUPROFEN 600 MG PO TABS: 600.0000 mg | ORAL_TABLET | Freq: Four times a day (QID) | ORAL | 0 refills | Status: AC

## 2018-09-19 NOTE — Plan of Care (Signed)
All outcomes met patient ready for discharge.

## 2018-09-19 NOTE — Progress Notes (Signed)
Faculty Attending Note  Post Partum Day 1  Subjective: Patient is feeling very well. She reports well controlled pain on PO pain meds. She is ambulating and denies light-headedness or dizziness. She is passing flatus. She is tolerating a regular diet without nausea/vomiting. Bleeding is moderate. She is breast feeding. Baby is in NICU and doing well.  Objective: Blood pressure 111/61, pulse 85, temperature 98.2 F (36.8 C), temperature source Oral, resp. rate 18, height 5\' 3"  (1.6 m), weight 72.6 kg, last menstrual period 01/07/2018, SpO2 96 %, unknown if currently breastfeeding. Temp:  [97.3 F (36.3 C)-98.3 F (36.8 C)] 98.2 F (36.8 C) (08/22 0800) Pulse Rate:  [57-85] 85 (08/22 0800) Resp:  [16-20] 18 (08/22 0800) BP: (97-123)/(54-82) 111/61 (08/22 0800) SpO2:  [96 %-100 %] 96 % (08/22 0800) Weight:  [72.6 kg] 72.6 kg (08/21 1945)  Physical Exam:  General: alert, oriented, cooperative Chest: normal respiratory effort Heart: RRR  Abdomen: soft, appropriately tender to palpation  Uterine Fundus: firm, 2 fingers below the umbilicus Lochia: moderate, rubra DVT Evaluation: no evidence of DVT Extremities: no edema, no calf tenderness  UOP: voiding spontaneously  Recent Labs    09/18/18 0551  HGB 11.7*  HCT 34.5*    Assessment/Plan: Patient Active Problem List   Diagnosis Date Noted  . Preterm premature rupture of membranes (PPROM) with unknown onset of labor 09/18/2018  . Rubella non-immune status, antepartum 09/18/2018  . Gestational diabetes 09/04/2018  . Pediatric pre-birth visit for expectant parent 08/26/2018  . Supervision of high risk pregnancy, antepartum 04/17/2018  . Vaginal bleeding before [redacted] weeks gestation 02/27/2018    Patient is 28 y.o. G1P0101 PPD#1 s/p SVD at [redacted]w[redacted]d. She is doing very well, recovering appropriately and complains only of not being able to have BM today. She is requesting to go home.  Continue routine post partum care Pain meds  prn Regular diet Undecided for birth control Plan for discharge today   K. Arvilla Meres, M.D. Attending Center for [employer] (Faculty Practice)  09/19/2018, 10:28 AM

## 2018-09-19 NOTE — Discharge Instructions (Signed)

## 2018-09-23 ENCOUNTER — Telehealth: Payer: Medicaid Other | Admitting: Family Medicine

## 2018-09-24 ENCOUNTER — Ambulatory Visit: Payer: Self-pay

## 2018-09-24 NOTE — Lactation Note (Signed)
This note was copied from a baby's chart. Lactation Consultation Note  Patient Name: Elaine Lewis URKYH'C Date: 09/24/2018 Reason for consult: Late-preterm 34-36.6wks;Primapara;1st time breastfeeding;NICU baby;Infant < 6lbs  P1 mother whose infant is now 52 days old.  This is a LPTI at 34+5 weeks with a CGA of 35+4 weeks weighing < 6 lbs and in the NICU.  Mother requested latch assistance.    RN in room when I arrived.  Mother was excited to attempt breast feeding.  This will be baby's first time attempting to latch.  Did a lot of education about the LPTI in relation to breast feeding; made sure mother aware that this is a first latch and it will take many days for the baby to learn, latch and feed well.  Mother verbalized understanding.  Discussed feeding her STS and mother agreeable.  Removed baby's clothing and placed her STS with mother.  She had the hiccoughs initially so waited until they stopped before latching.  Discussed breast feeding basics and attempted to latch baby to the left breast in the cross cradle hold.  Baby was awake and quiet but showed no interest in opening mouth for latching.  Reviewed hand expression and mother able to express drops.  I finger fed these drops back to her.  Attempted a second time with the same result.  Reassured mother that this is very typical.  Babies at this age need time to practice and suggested mother continue STS.  If baby begins to show cues or nudge toward the breast then she will try again.  Showed mother how to observe baby for readiness.  After 5 minutes baby very sleepy and I suggested no further attempts for today.  Mother agreed.  RN in room to reiterate this typical behavior.  Mother understands, but, she is just very interested in wanting to see her baby breast feed.  She will continue to pump every three hours and bring all EBM to the NICU.  She will call for further questions/concerns as they arise.  Mother will use LC support as needed.   Father present.  RN updated.   Maternal Data Formula Feeding for Exclusion: No Has patient been taught Hand Expression?: Yes Does the patient have breastfeeding experience prior to this delivery?: No  Feeding Feeding Type: Breast Fed Nipple Type: Nfant Slow Flow (purple)  LATCH Score Latch: Too sleepy or reluctant, no latch achieved, no sucking elicited.  Audible Swallowing: None  Type of Nipple: Everted at rest and after stimulation  Comfort (Breast/Nipple): Soft / non-tender  Hold (Positioning): Assistance needed to correctly position infant at breast and maintain latch.  LATCH Score: 5  Interventions Interventions: Breast feeding basics reviewed;Assisted with latch;Skin to skin;Breast massage;Hand express;DEBP;Position options;Support pillows  Lactation Tools Discussed/Used Tools: Pump   Consult Status Consult Status: PRN Date: 09/24/18 Follow-up type: Call as needed    Elaine Lewis 09/24/2018, 3:14 PM

## 2018-10-21 ENCOUNTER — Other Ambulatory Visit: Payer: Self-pay | Admitting: *Deleted

## 2018-10-21 DIAGNOSIS — Z8632 Personal history of gestational diabetes: Secondary | ICD-10-CM

## 2018-10-23 ENCOUNTER — Telehealth: Payer: Self-pay | Admitting: Obstetrics and Gynecology

## 2018-10-23 NOTE — Telephone Encounter (Signed)
Attempted to call patient about her appointment on 9/28 @ 8:20. No answer left voicemail instructing patient to wear a face mask for the entire appointment and no visitors are allowed during the visit. Patient instructed not to attend the appointment if she was any symptoms. Symptom list and office number left.

## 2018-10-26 ENCOUNTER — Other Ambulatory Visit: Payer: Medicaid Other

## 2018-10-26 ENCOUNTER — Ambulatory Visit: Payer: Medicaid Other | Admitting: Obstetrics and Gynecology

## 2018-11-02 ENCOUNTER — Other Ambulatory Visit: Payer: Self-pay

## 2018-11-02 ENCOUNTER — Other Ambulatory Visit: Payer: Medicaid Other

## 2018-11-02 ENCOUNTER — Ambulatory Visit (INDEPENDENT_AMBULATORY_CARE_PROVIDER_SITE_OTHER): Payer: Medicaid Other | Admitting: Family Medicine

## 2018-11-02 DIAGNOSIS — Z8632 Personal history of gestational diabetes: Secondary | ICD-10-CM

## 2018-11-02 DIAGNOSIS — Z1389 Encounter for screening for other disorder: Secondary | ICD-10-CM | POA: Diagnosis not present

## 2018-11-02 NOTE — Progress Notes (Signed)
Subjective:     Elaine Lewis is a 28 y.o. female who presents for a postpartum visit. She is 6 weeks postpartum following a spontaneous vaginal delivery. I have fully reviewed the prenatal and intrapartum course. The delivery was at [redacted]w[redacted]d gestational weeks. Outcome: spontaneous vaginal delivery. Anesthesia: none. Postpartum course has been normal. Baby's course has been significant for a 2 week stay in NICU, and mom says recent dark green discharge that started last night and decreased feeding (she will have her evaluated by the ER because the pediatrician's office is not responding to her MyChart message). Baby is feeding by bottle feeding with pumped breast milk, problems latching. Bleeding no bleeding. Bowel function is abnormal: Constipation. Bladder function is normal. Patient is sexually active. Contraception method is none. Postpartum depression screening: negative.  Review of Systems Pertinent items are noted in HPI.   Objective:    BP 103/62   Pulse 60   Temp 98.3 F (36.8 C)   Wt 138 lb 14.4 oz (63 kg)   LMP 01/07/2018   BMI 24.61 kg/m   General:  alert, cooperative and appears stated age   Breasts:  inspection negative, no nipple discharge or bleeding, no masses or nodularity palpable  Lungs: clear to auscultation bilaterally  Heart:  regular rate and rhythm, S1, S2 normal, no murmur, click, rub or gallop  Abdomen: soft, non-tender; bowel sounds normal; no masses,  no organomegaly   Vulva:  not evaluated  Vagina: not evaluated  Cervix:  not evaluated  Corpus: not examined  Adnexa:  not evaluated  Rectal Exam: Not performed.        Assessment:     Normal postpartum exam. Pap smear not done at today's visit. Normal in January 2020.  Plan:  1. Encounter for postpartum visit -Contraception: none  -Pap smear in 2023 -Follow up in: when Pap due , birth control desired, or as needed.    Merilyn Baba, DO OB Fellow, Faculty Practice 11/02/2018 11:01 AM

## 2018-11-02 NOTE — Patient Instructions (Signed)
Breastfeeding and Self-Care It is normal to have some problems when you start to breastfeed your new baby. But there are things that you can do to take care of yourself and help prevent many common problems. This includes keeping your breasts healthy and making sure that your baby's mouth attaches (latches) properly to your nipple for feedings. Work with your doctor or breastfeeding specialist (lactation consultant) to find what works best for you. Follow these instructions at home: Breastfeeding strategy   Always make sure that your baby latches properly to breastfeed.  Make sure that your baby is in a proper position. Try different breastfeeding positions to find one that works best for you and your baby.  Breastfeed when you feel like you need to make your breasts less full or when your baby shows signs of hunger. This is called "breastfeeding on demand."  Do not delay feedings.  Try to relax when it is time to feed your baby. This helps your body release milk from your breast.  To help increase milk flow: ? Remove a small amount of milk from your breast right before breastfeeding. Do this using a pump or by squeezing with your hand. ? Apply warm, moist heat to your breast right before feeding. You can do this in the shower or with hand towels soaked with warm water. ? Massage your breast right before or during feeding. Breast care   To help your breasts stay healthy and keep them from getting too dry: ? Avoid using soap on your nipples. ? Let your nipples air-dry for 3-4 minutes after each feeding. ? Use only cotton bra pads to soak up breast milk that leaks. Be sure to change the pads if they become soaked with milk. If you use bra pads that can be thrown away, change them often. ? Put some lanolin on your nipples after breastfeeding. Pure lanolin does not need to be washed off your nipple before you feed your baby again. Pure lanolin is not harmful to your baby. ? Rub some breast  milk into your nipples: ? Use your hand to squeeze out a few drops of breast milk. ? Gently massage the milk into your nipples. ? Let your nipples air-dry.  Wear a supportive nursing bra. Avoid wearing: ? Tight clothing. ? Underwire bras or bras that put pressure on your breasts.  Use ice to help relieve pain or swelling of your breasts: ? Put ice in a plastic bag. ? Place a towel between your skin and the bag. ? Leave the ice on for 20 minutes, 2-3 times a day. General instructions  Drink enough fluid to keep your pee (urine) pale yellow.  Get plenty of rest. Sleep when your baby sleeps.  Talk to your doctor or breastfeeding specialist before taking any herbal supplements. Contact a health care provider if:  You have nipple pain.  You have cracking or soreness in your nipples that lasts longer than 1 week.  Your breasts are overfilled with milk (engorgement) and this lasts longer than 48 hours.  You have a fever.  You have pus-like fluid coming from your nipple.  You have redness, a rash, swelling, itching, or burning on your breast.  Your baby does not gain weight.  Your baby loses weight. Summary  There are things that you can do to take care of yourself and help prevent many common breastfeeding problems.  Always make sure that your baby's mouth attaches (latches) to your nipple properly to breastfeed.  Keep your nipples   from getting too dry, drink plenty of fluid, and get plenty of rest.  Feed on demand. Do not delay feedings. This information is not intended to replace advice given to you by your health care provider. Make sure you discuss any questions you have with your health care provider. Document Released: 08/21/2016 Document Revised: 05/06/2018 Document Reviewed: 08/21/2016 Elsevier Patient Education  2020 [employer] Inc[employer]  

## 2018-11-03 ENCOUNTER — Encounter: Payer: Self-pay | Admitting: Obstetrics and Gynecology

## 2018-11-03 LAB — GLUCOSE TOLERANCE, 2 HOURS
Glucose, 2 hour: 126 mg/dL (ref 65–139)
Glucose, GTT - Fasting: 79 mg/dL (ref 65–99)

## 2018-12-30 ENCOUNTER — Other Ambulatory Visit: Payer: Self-pay

## 2018-12-30 DIAGNOSIS — Z20822 Contact with and (suspected) exposure to covid-19: Secondary | ICD-10-CM

## 2019-01-03 LAB — NOVEL CORONAVIRUS, NAA: SARS-CoV-2, NAA: NOT DETECTED

## 2019-01-18 ENCOUNTER — Ambulatory Visit: Payer: Medicaid Other | Admitting: Family Medicine

## 2020-03-24 IMAGING — US US OB < 14 WEEKS - US OB TV
1 series · 15 of 23 positions shown · non-contrast
Comparison: None.

CLINICAL DATA: Bleeding in early pregnancy.

EXAM:
OBSTETRIC <14 WK US AND TRANSVAGINAL OB US
TECHNIQUE: Transvaginal ultrasound was performed for complete evaluation of the
gestation as well as the maternal uterus, adnexal regions, and
pelvic cul-de-sac.

[Series 1: us ob < 14 weeks - us ob tv · 15 of 23 slices shown]
[im 1/23]
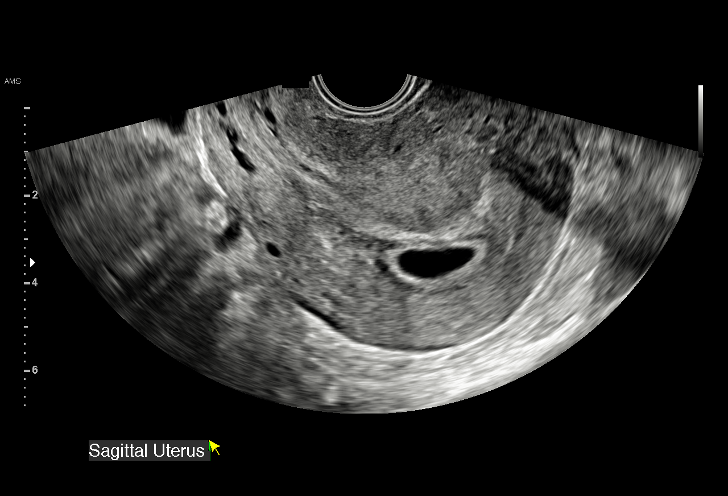
[im 3/23]
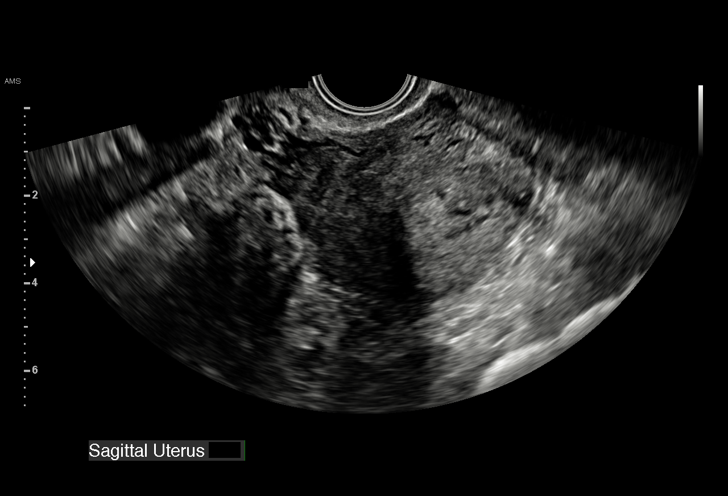
[im 4/23]
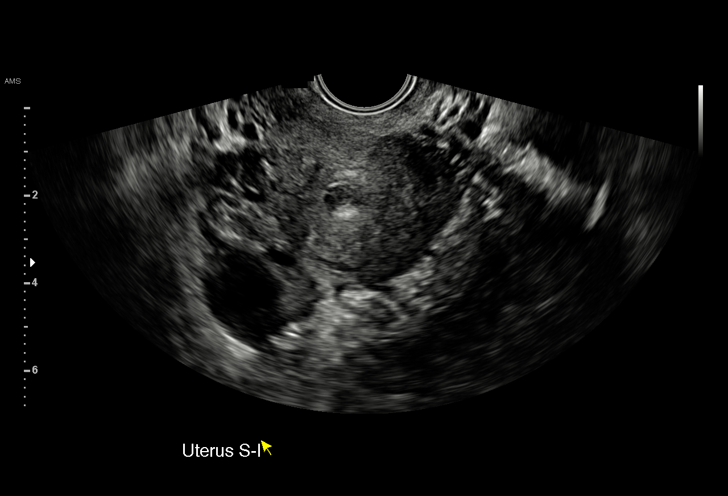
[im 6/23]
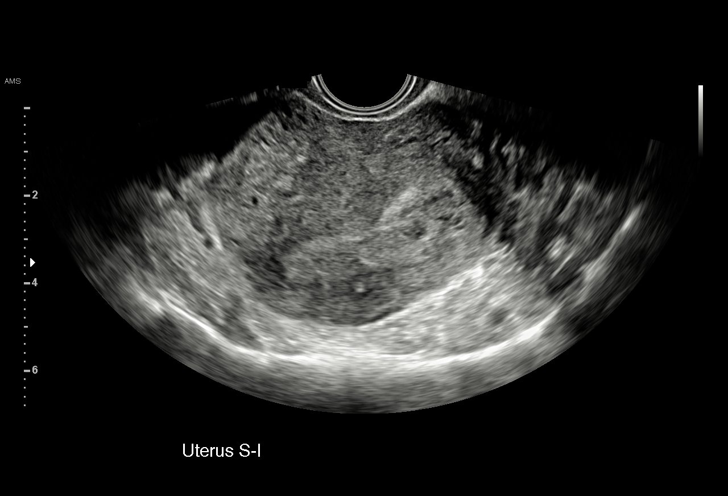
[im 7/23]
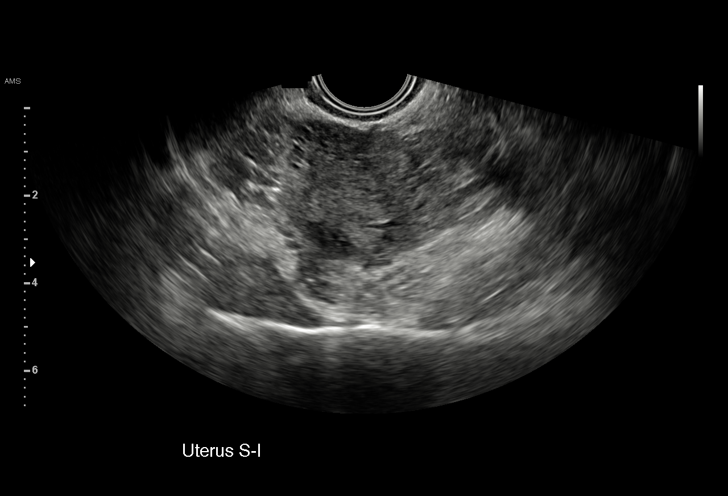
[im 9/23]
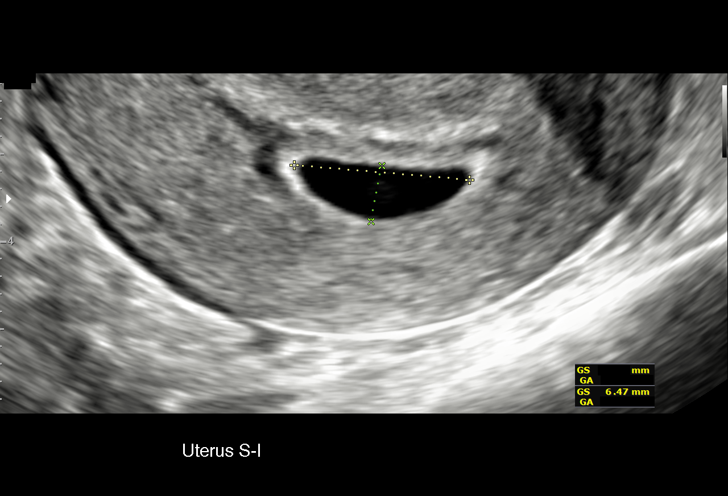
[im 10/23]
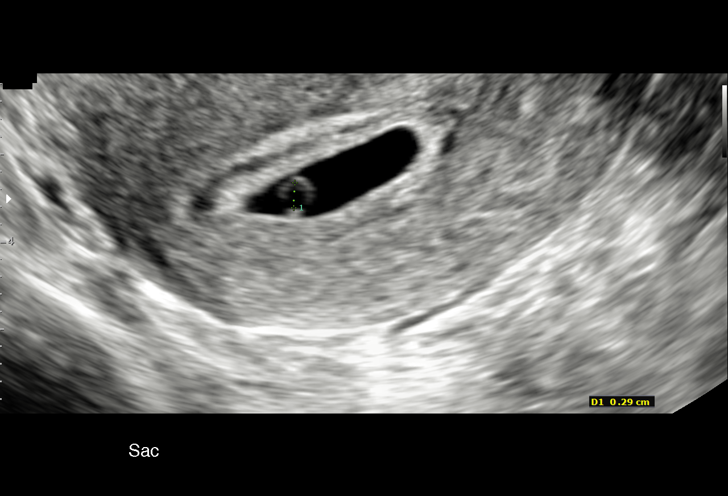
[im 12/23]
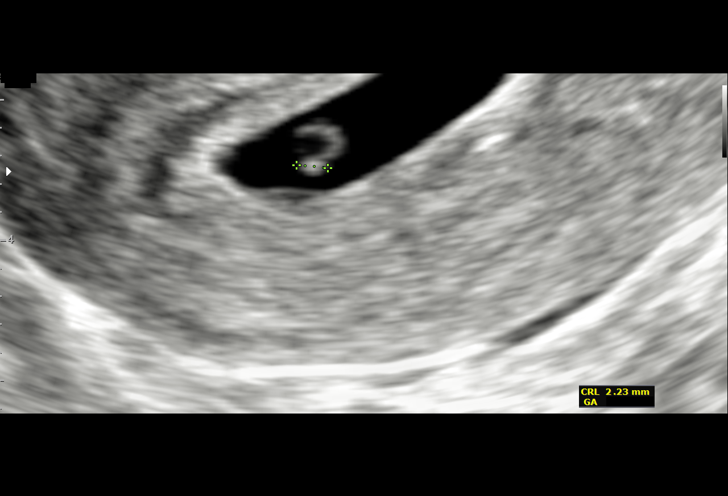
[im 14/23]
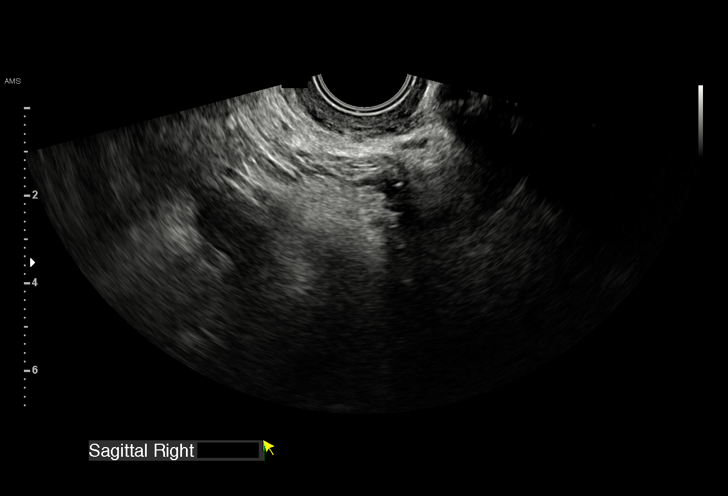
[im 15/23]
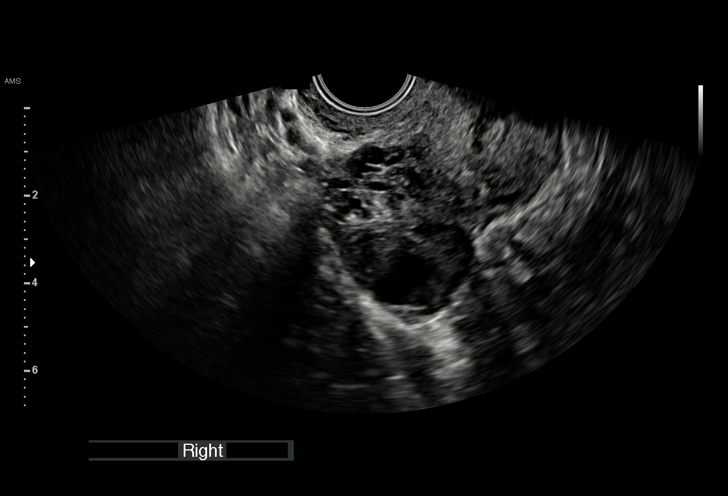
[im 17/23]
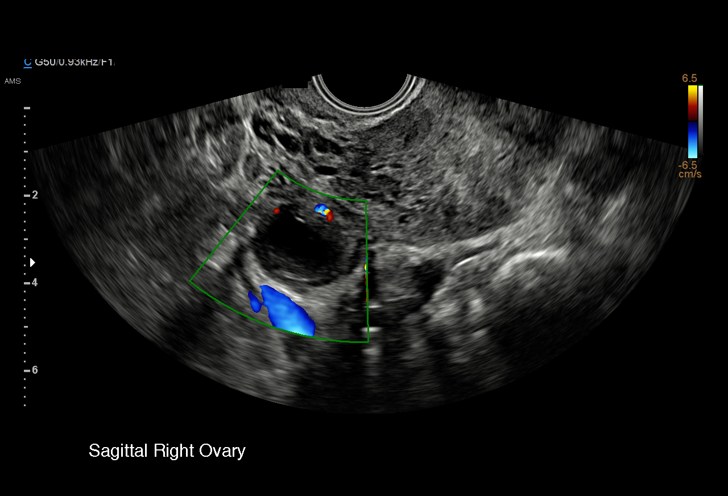
[im 18/23]
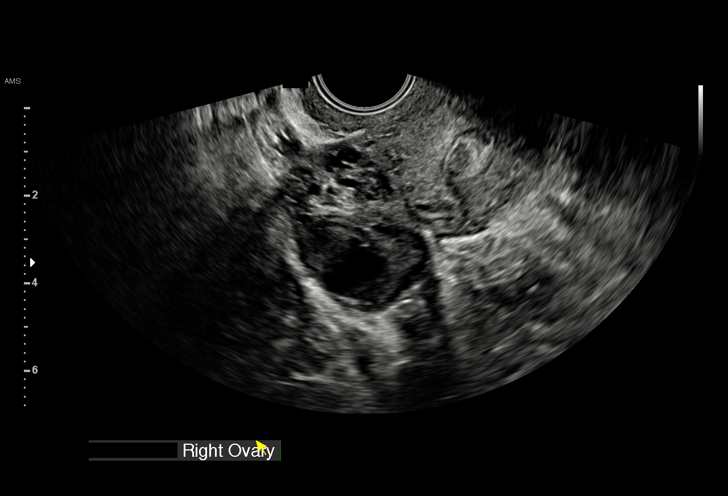
[im 20/23]
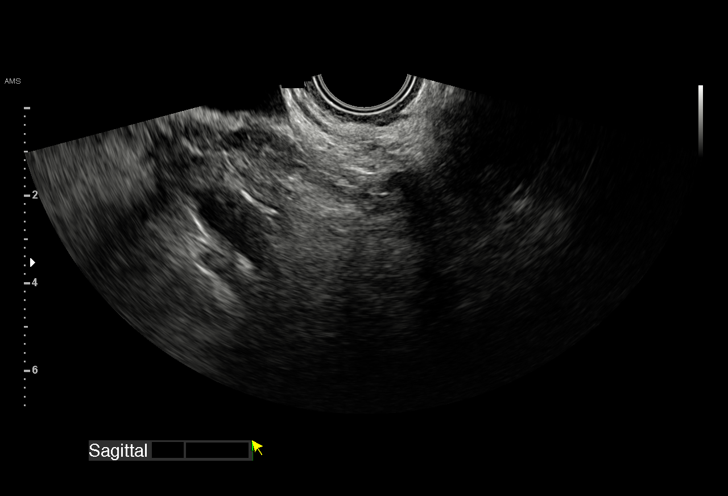
[im 21/23]
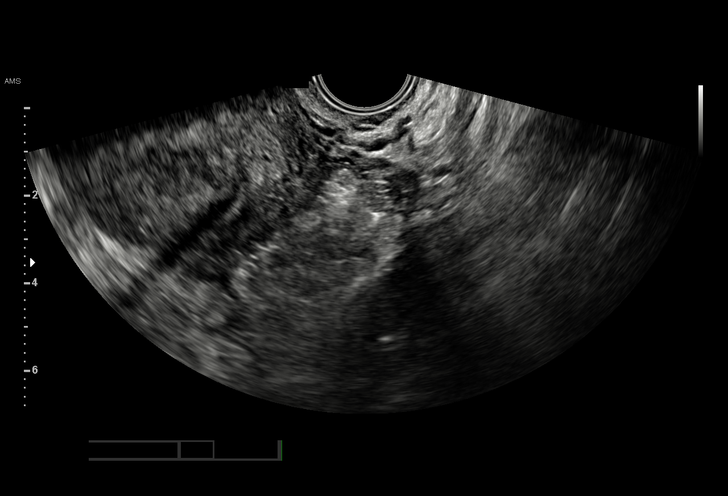
[im 23/23]
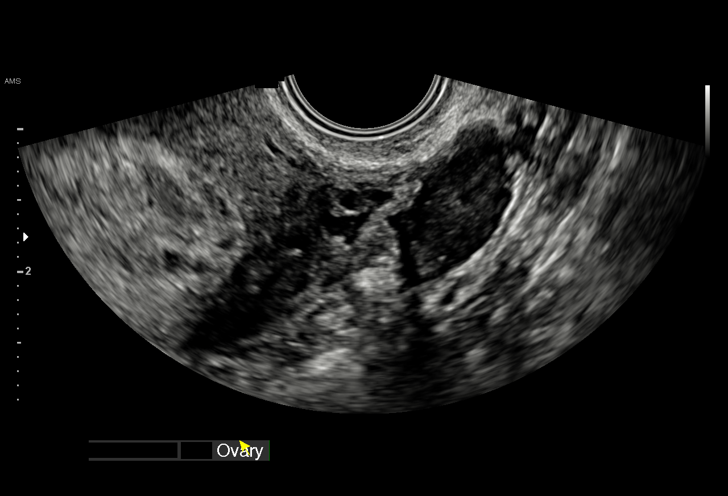

[15 of 23 positions shown; findings below may reference images not displayed]

FINDINGS: Intrauterine gestational sac: Single

Yolk sac:  Visualized.

Embryo:  Visualized.

Cardiac Activity: Visualized.

Heart Rate: 65 bpm

CRL:   2.18 mm   5 w 5 d                  US EDC: 10/23/2018

Subchorionic hemorrhage:  Small subchorionic hemorrhage.

Maternal uterus/adnexae:

Right ovary: Normal

Left ovary: Normal

Other :None

Free fluid:  None
IMPRESSION: 1. There is a single living intrauterine gestation with an estimated
gestational age of 5 weeks and 5 days.
2. Fetal bradycardia. Although findings may be seen with early
gestational age, findings are suspicious and follow-up US in 10-14
days is advised to reassess fetal viability.
3. Small subchorionic hemorrhage.

## 2020-06-28 IMAGING — US US MFM OB COMP + 14 WK
1 series · 14 of 28 positions shown · non-contrast
Comparison: none

[Series 1: us mfm ob comp + 14 wk · 123 acquisitions, 14 frames shown]
[im 5/123]
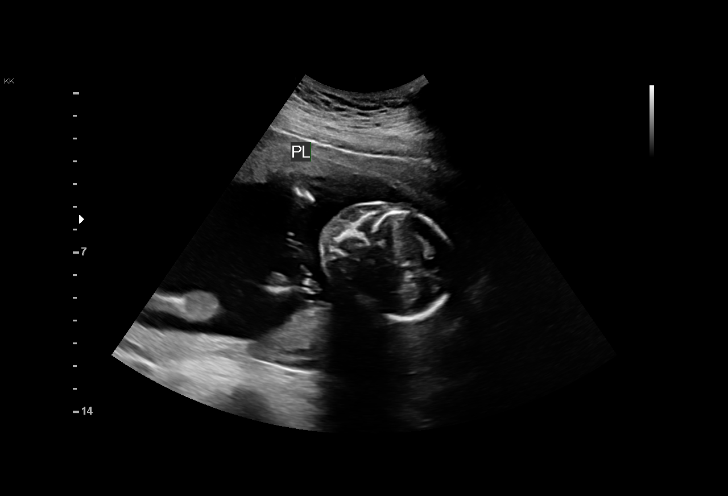
[im 14/123]
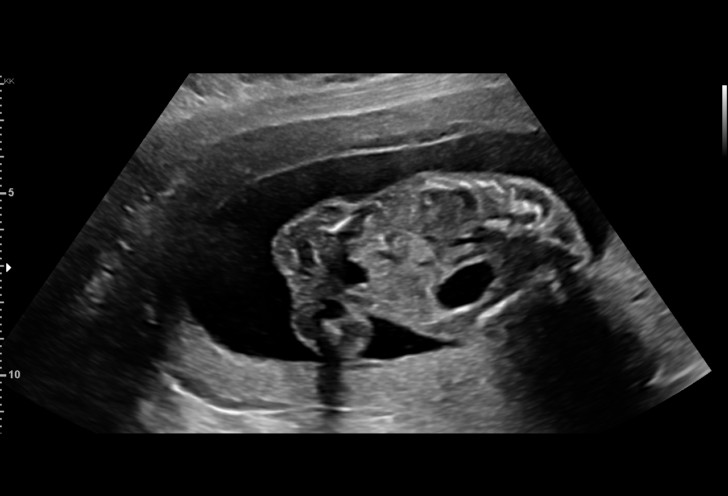
[im 23/123]
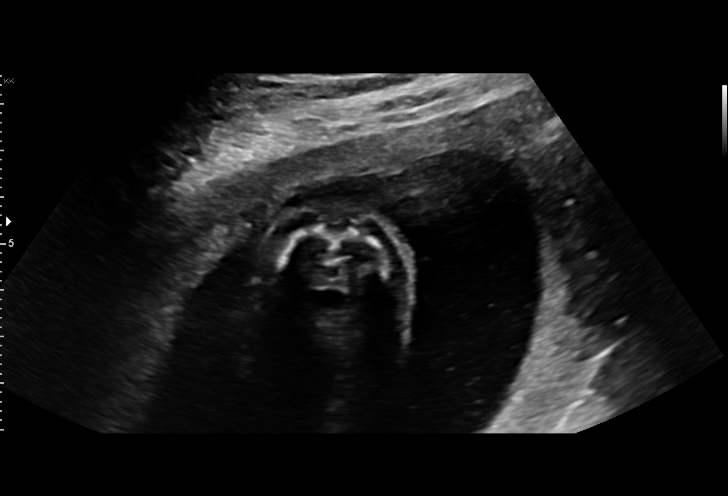
[im 32/123]
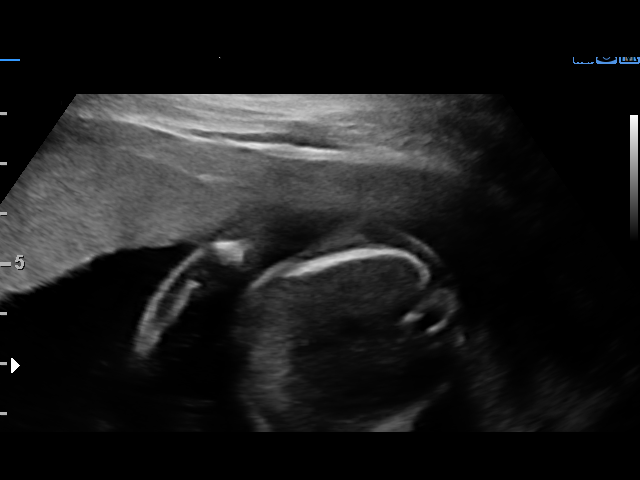
[im 41/123]
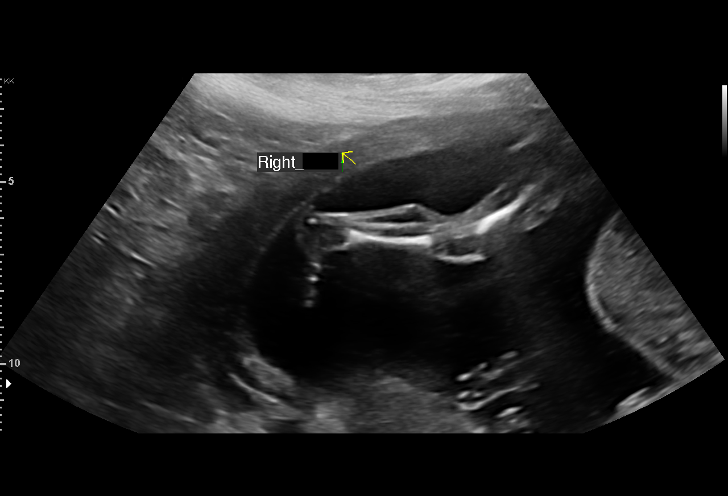
[im 50/123]
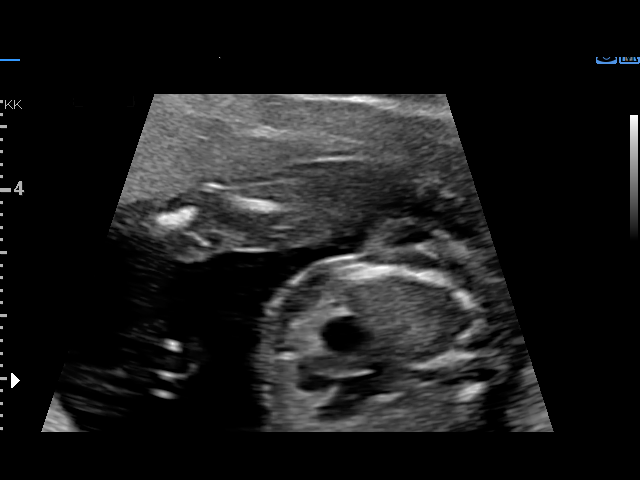
[im 59/123]
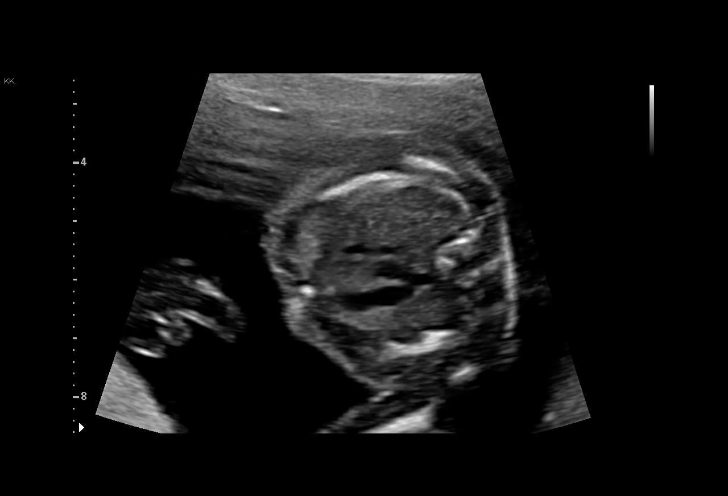
[im 68/123]
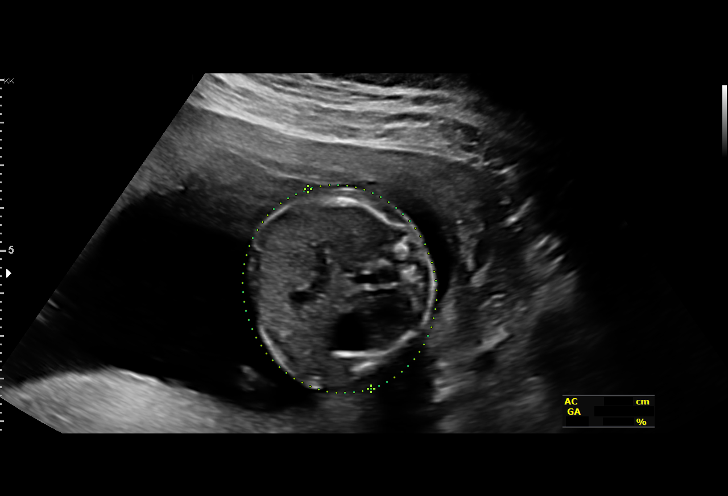
[im 77/123]
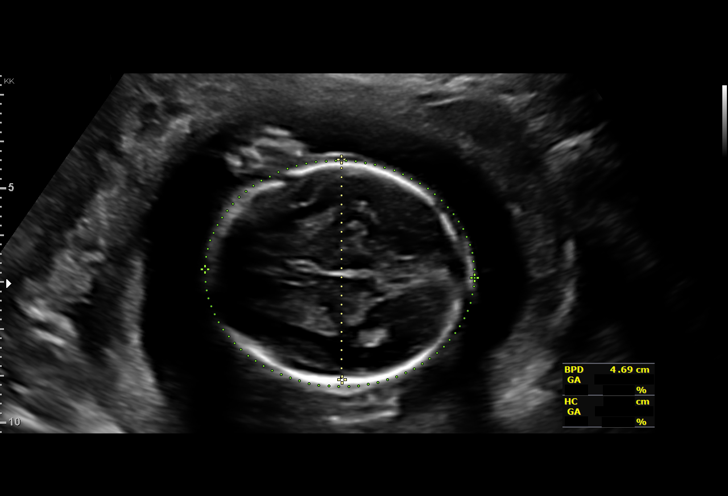
[im 86/123]
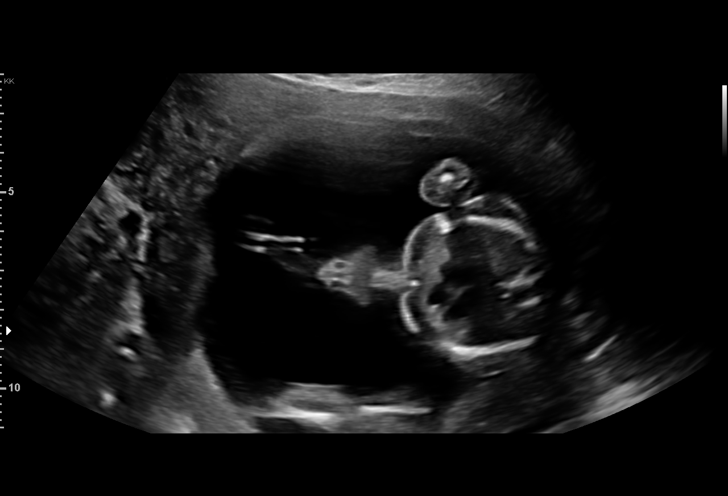
[im 95/123]
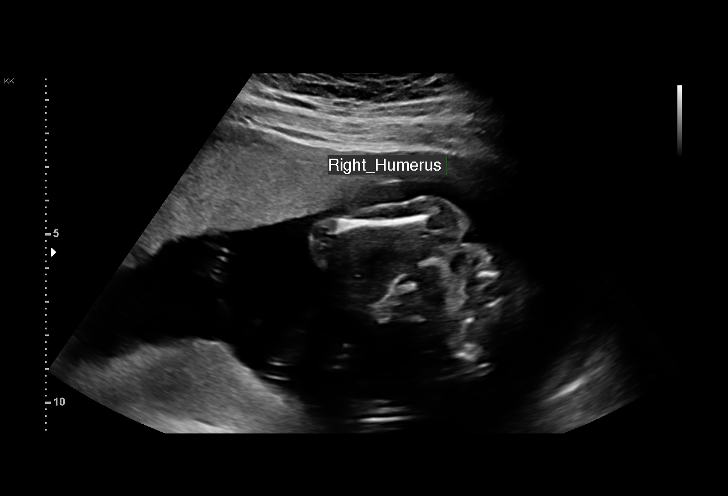
[im 104/123]
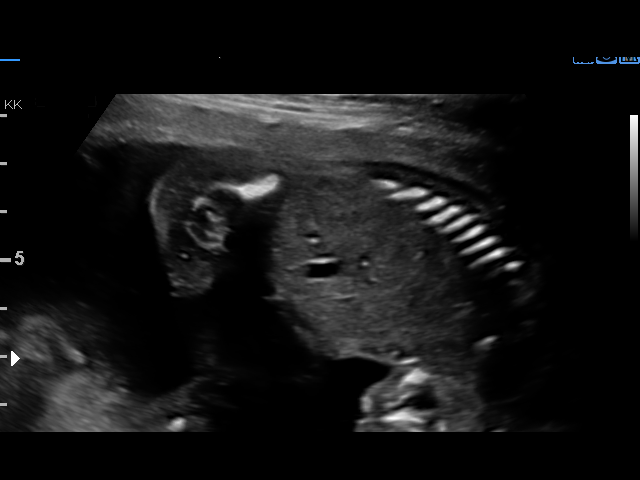
[im 113/123]
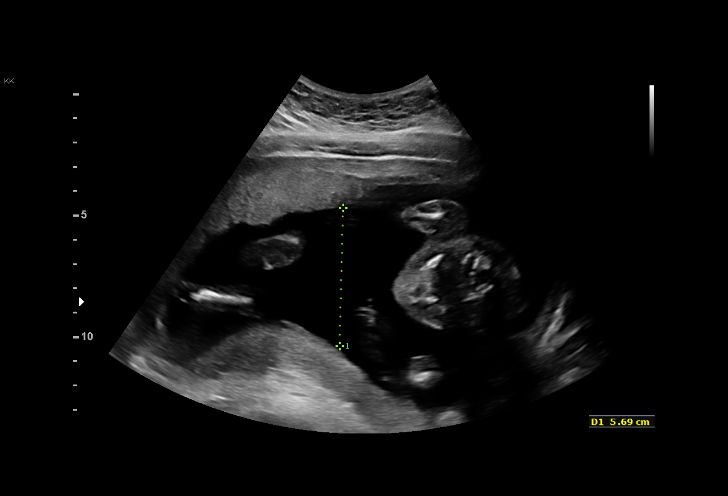
[im 123/123]
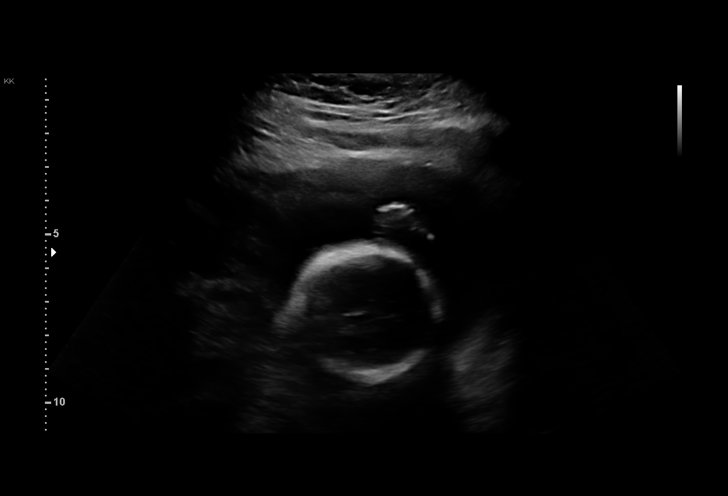

[14 of 28 positions shown; findings below may reference images not displayed]

[REDACTED]. [HOSPITAL],
                   [facility]

 ----------------------------------------------------------------------

 ----------------------------------------------------------------------
Indications

  Encounter for antenatal screening for
  malformations (Low Risk NIPS)
  19 weeks gestation of pregnancy
 ----------------------------------------------------------------------
Fetal Evaluation

 Num Of Fetuses:          1
 Fetal Heart Rate(bpm):   138
 Cardiac Activity:        Observed
 Presentation:            Cephalic
 Placenta:                Anterior Fundal
 P. Cord Insertion:       Visualized

 Amniotic Fluid
 AFI FV:      Within normal limits

                             Largest Pocket(cm)

Biometry

 BPD:      46.6  mm     G. Age:  20w 1d         77  %    CI:        80.54   %    70 - 86
                                                         FL/HC:       17.4  %    16.1 -
 HC:       164   mm     G. Age:  19w 1d         29  %    HC/AC:       1.12       1.09 -
 AC:      145.8  mm     G. Age:  19w 6d         60  %    FL/BPD:      61.4  %
 FL:       28.6  mm     G. Age:  18w 5d         22  %    FL/AC:       19.6  %    20 - 24
 NFT:       4.4  mm

 Est. FW:     289   gm   0 lb 10 oz      46  %
OB History
 Gravidity:    1
Gestational Age

 LMP:           21w 0d        Date:  01/07/18                 EDD:   10/14/18
 U/S Today:     19w 3d                                        EDD:   10/25/18
 Best:          19w 3d     Det. By:  Early Ultrasound         EDD:   10/25/18
                                     (02/27/18)
Anatomy

 Cranium:               Appears normal         Aortic Arch:            Appears normal
 Cavum:                 Appears normal         Ductal Arch:            Not well visualized
 Ventricles:            Appears normal         Diaphragm:              Appears normal
 Choroid Plexus:        Appears normal         Stomach:                Appears normal, left
                                                                       sided
 Cerebellum:            Appears normal         Abdomen:                Appears normal
 Posterior Fossa:       Appears normal         Abdominal Wall:         Appears nml (cord
                                                                       insert, abd wall)
 Nuchal Fold:           Appears normal         Cord Vessels:           Appears normal (3
                                                                       vessel cord)
 Face:                  Orbits nl; profile not Kidneys:                Appear normal
                        well visualized
 Lips:                  Appears normal         Bladder:                Appears normal
 Thoracic:              Appears normal         Spine:                  Appears normal
 Heart:                 Appears normal         Upper Extremities:      Appears normal
                        (4CH, axis, and
                        situs)
 RVOT:                  Appears normal         Lower Extremities:      Appears normal
 LVOT:                  Appears normal

 Other:  Female gender Heels visualized. Technically difficult due to fetal
         position.
Cervix Uterus Adnexa

 Cervix
 Length:            3.7  cm.
 Normal appearance by transabdominal scan.
Impression

 We performed fetal anatomy scan. No makers of
 aneuploidies or fetal structural defects are seen. Fetal
 biometry is consistent with her previously-established dates.
 Amniotic fluid is normal and good fetal activity is seen.

 On cell-free fetal DNA screening, the risks of fetal
 aneuploidies are not increased.
Recommendations

 An appointment was made for her to return in 5 weeks for
 completion of fetal anatomy.
                 Schermerhorn, Keerthana
